# Patient Record
Sex: Female | Born: 1945
Health system: Southern US, Community
[De-identification: ages and names within clinical notes are randomized; demographics above are authoritative.]

## PROBLEM LIST (undated history)

## (undated) DIAGNOSIS — J45990 Exercise induced bronchospasm: Secondary | ICD-10-CM

## (undated) DIAGNOSIS — R432 Parageusia: Secondary | ICD-10-CM

## (undated) DIAGNOSIS — E785 Hyperlipidemia, unspecified: Secondary | ICD-10-CM

## (undated) DIAGNOSIS — I10 Essential (primary) hypertension: Secondary | ICD-10-CM

## (undated) DIAGNOSIS — R05 Cough: Secondary | ICD-10-CM

## (undated) DIAGNOSIS — I447 Left bundle-branch block, unspecified: Secondary | ICD-10-CM

## (undated) DIAGNOSIS — M1990 Unspecified osteoarthritis, unspecified site: Secondary | ICD-10-CM

## (undated) DIAGNOSIS — R9439 Abnormal result of other cardiovascular function study: Secondary | ICD-10-CM

## (undated) DIAGNOSIS — G2581 Restless legs syndrome: Secondary | ICD-10-CM

## (undated) DIAGNOSIS — E039 Hypothyroidism, unspecified: Secondary | ICD-10-CM

## (undated) HISTORY — DX: Parageusia: R43.2

## (undated) HISTORY — PX: TONSILLECTOMY: SUR1361

## (undated) HISTORY — DX: Exercise induced bronchospasm: J45.990

## (undated) HISTORY — PX: LIPOMA EXCISION: SHX5283

## (undated) HISTORY — PX: PARATHYROIDECTOMY: SHX19

## (undated) HISTORY — DX: Left bundle-branch block, unspecified: I44.7

## (undated) HISTORY — DX: Abnormal result of other cardiovascular function study: R94.39

## (undated) HISTORY — DX: Hyperlipidemia, unspecified: E78.5

## (undated) HISTORY — DX: Cough: R05

## (undated) HISTORY — PX: THYROIDECTOMY, PARTIAL: SHX18

## (undated) HISTORY — PX: CARPAL TUNNEL RELEASE: SHX101

---

## 1998-03-16 ENCOUNTER — Ambulatory Visit (HOSPITAL_COMMUNITY): Admission: RE | Admit: 1998-03-16 | Discharge: 1998-03-16 | Payer: Self-pay | Admitting: *Deleted

## 1998-06-23 ENCOUNTER — Ambulatory Visit (HOSPITAL_COMMUNITY): Admission: RE | Admit: 1998-06-23 | Discharge: 1998-06-24 | Payer: Self-pay

## 1998-08-09 ENCOUNTER — Other Ambulatory Visit: Admission: RE | Admit: 1998-08-09 | Discharge: 1998-08-09 | Payer: Self-pay | Admitting: Obstetrics and Gynecology

## 1998-08-26 ENCOUNTER — Other Ambulatory Visit: Admission: RE | Admit: 1998-08-26 | Discharge: 1998-08-26 | Payer: Self-pay | Admitting: Obstetrics and Gynecology

## 1998-11-05 HISTORY — PX: BREAST EXCISIONAL BIOPSY: SUR124

## 1999-08-15 ENCOUNTER — Other Ambulatory Visit: Admission: RE | Admit: 1999-08-15 | Discharge: 1999-08-15 | Payer: Self-pay | Admitting: Obstetrics and Gynecology

## 1999-08-16 ENCOUNTER — Encounter: Admission: RE | Admit: 1999-08-16 | Discharge: 1999-08-16 | Payer: Self-pay

## 1999-08-17 ENCOUNTER — Ambulatory Visit (HOSPITAL_BASED_OUTPATIENT_CLINIC_OR_DEPARTMENT_OTHER): Admission: RE | Admit: 1999-08-17 | Discharge: 1999-08-17 | Payer: Self-pay

## 2000-01-08 ENCOUNTER — Encounter: Payer: Self-pay | Admitting: Obstetrics and Gynecology

## 2000-01-08 ENCOUNTER — Encounter: Admission: RE | Admit: 2000-01-08 | Discharge: 2000-01-08 | Payer: Self-pay | Admitting: Obstetrics and Gynecology

## 2000-01-15 ENCOUNTER — Encounter: Payer: Self-pay | Admitting: Obstetrics and Gynecology

## 2000-01-15 ENCOUNTER — Encounter: Admission: RE | Admit: 2000-01-15 | Discharge: 2000-01-15 | Payer: Self-pay | Admitting: Obstetrics and Gynecology

## 2000-07-27 ENCOUNTER — Encounter: Payer: Self-pay | Admitting: Family Medicine

## 2000-07-27 ENCOUNTER — Ambulatory Visit (HOSPITAL_COMMUNITY): Admission: RE | Admit: 2000-07-27 | Discharge: 2000-07-27 | Payer: Self-pay | Admitting: Family Medicine

## 2000-08-13 ENCOUNTER — Encounter: Payer: Self-pay | Admitting: Family Medicine

## 2000-08-13 ENCOUNTER — Ambulatory Visit: Admission: RE | Admit: 2000-08-13 | Discharge: 2000-08-13 | Payer: Self-pay | Admitting: *Deleted

## 2000-09-10 ENCOUNTER — Other Ambulatory Visit: Admission: RE | Admit: 2000-09-10 | Discharge: 2000-09-10 | Payer: Self-pay | Admitting: Obstetrics and Gynecology

## 2000-12-25 ENCOUNTER — Encounter: Admission: RE | Admit: 2000-12-25 | Discharge: 2000-12-25 | Payer: Self-pay | Admitting: Obstetrics and Gynecology

## 2000-12-25 ENCOUNTER — Encounter: Payer: Self-pay | Admitting: Obstetrics and Gynecology

## 2001-08-29 ENCOUNTER — Ambulatory Visit (HOSPITAL_COMMUNITY): Admission: RE | Admit: 2001-08-29 | Discharge: 2001-08-29 | Payer: Self-pay | Admitting: *Deleted

## 2001-10-15 ENCOUNTER — Other Ambulatory Visit: Admission: RE | Admit: 2001-10-15 | Discharge: 2001-10-15 | Payer: Self-pay | Admitting: Obstetrics and Gynecology

## 2001-12-12 ENCOUNTER — Encounter: Payer: Self-pay | Admitting: Family Medicine

## 2001-12-12 ENCOUNTER — Ambulatory Visit (HOSPITAL_COMMUNITY): Admission: RE | Admit: 2001-12-12 | Discharge: 2001-12-12 | Payer: Self-pay | Admitting: Family Medicine

## 2002-01-01 ENCOUNTER — Encounter: Payer: Self-pay | Admitting: Obstetrics and Gynecology

## 2002-01-01 ENCOUNTER — Encounter: Admission: RE | Admit: 2002-01-01 | Discharge: 2002-01-01 | Payer: Self-pay | Admitting: Obstetrics and Gynecology

## 2002-10-21 ENCOUNTER — Other Ambulatory Visit: Admission: RE | Admit: 2002-10-21 | Discharge: 2002-10-21 | Payer: Self-pay | Admitting: Obstetrics and Gynecology

## 2002-12-07 ENCOUNTER — Encounter: Admission: RE | Admit: 2002-12-07 | Discharge: 2002-12-07 | Payer: Self-pay | Admitting: Neurology

## 2002-12-07 ENCOUNTER — Encounter: Payer: Self-pay | Admitting: Neurology

## 2003-01-11 ENCOUNTER — Encounter: Admission: RE | Admit: 2003-01-11 | Discharge: 2003-01-11 | Payer: Self-pay | Admitting: Family Medicine

## 2003-01-11 ENCOUNTER — Encounter: Payer: Self-pay | Admitting: Family Medicine

## 2003-11-16 ENCOUNTER — Other Ambulatory Visit: Admission: RE | Admit: 2003-11-16 | Discharge: 2003-11-16 | Payer: Self-pay | Admitting: Obstetrics and Gynecology

## 2004-01-14 ENCOUNTER — Encounter: Admission: RE | Admit: 2004-01-14 | Discharge: 2004-01-14 | Payer: Self-pay | Admitting: Obstetrics and Gynecology

## 2004-01-21 ENCOUNTER — Ambulatory Visit (HOSPITAL_COMMUNITY): Admission: RE | Admit: 2004-01-21 | Discharge: 2004-01-21 | Payer: Self-pay | Admitting: Family Medicine

## 2004-08-24 ENCOUNTER — Encounter (INDEPENDENT_AMBULATORY_CARE_PROVIDER_SITE_OTHER): Payer: Self-pay | Admitting: *Deleted

## 2004-08-24 ENCOUNTER — Ambulatory Visit (HOSPITAL_COMMUNITY): Admission: RE | Admit: 2004-08-24 | Discharge: 2004-08-24 | Payer: Self-pay | Admitting: Surgery

## 2004-08-24 ENCOUNTER — Ambulatory Visit (HOSPITAL_BASED_OUTPATIENT_CLINIC_OR_DEPARTMENT_OTHER): Admission: RE | Admit: 2004-08-24 | Discharge: 2004-08-24 | Payer: Self-pay | Admitting: Surgery

## 2004-12-11 ENCOUNTER — Other Ambulatory Visit: Admission: RE | Admit: 2004-12-11 | Discharge: 2004-12-11 | Payer: Self-pay | Admitting: Obstetrics and Gynecology

## 2005-01-19 ENCOUNTER — Encounter: Admission: RE | Admit: 2005-01-19 | Discharge: 2005-01-19 | Payer: Self-pay | Admitting: Obstetrics and Gynecology

## 2005-09-15 ENCOUNTER — Ambulatory Visit (HOSPITAL_COMMUNITY): Admission: RE | Admit: 2005-09-15 | Discharge: 2005-09-15 | Payer: Self-pay | Admitting: Family Medicine

## 2005-09-29 ENCOUNTER — Ambulatory Visit (HOSPITAL_COMMUNITY): Admission: RE | Admit: 2005-09-29 | Discharge: 2005-09-29 | Payer: Self-pay | Admitting: Family Medicine

## 2006-01-03 ENCOUNTER — Other Ambulatory Visit: Admission: RE | Admit: 2006-01-03 | Discharge: 2006-01-03 | Payer: Self-pay | Admitting: Obstetrics and Gynecology

## 2006-01-21 ENCOUNTER — Encounter: Admission: RE | Admit: 2006-01-21 | Discharge: 2006-01-21 | Payer: Self-pay | Admitting: Obstetrics and Gynecology

## 2007-01-23 ENCOUNTER — Encounter: Admission: RE | Admit: 2007-01-23 | Discharge: 2007-01-23 | Payer: Self-pay | Admitting: Obstetrics and Gynecology

## 2008-01-26 ENCOUNTER — Encounter: Admission: RE | Admit: 2008-01-26 | Discharge: 2008-01-26 | Payer: Self-pay | Admitting: Obstetrics and Gynecology

## 2008-07-07 ENCOUNTER — Ambulatory Visit: Admission: RE | Admit: 2008-07-07 | Discharge: 2008-07-07 | Payer: Self-pay | Admitting: Family Medicine

## 2008-07-07 LAB — PULMONARY FUNCTION TEST

## 2008-11-14 ENCOUNTER — Emergency Department (HOSPITAL_COMMUNITY): Admission: EM | Admit: 2008-11-14 | Discharge: 2008-11-14 | Payer: Self-pay | Admitting: Emergency Medicine

## 2009-01-26 ENCOUNTER — Encounter: Admission: RE | Admit: 2009-01-26 | Discharge: 2009-01-26 | Payer: Self-pay | Admitting: Obstetrics and Gynecology

## 2009-10-03 ENCOUNTER — Encounter: Admission: RE | Admit: 2009-10-03 | Discharge: 2009-10-03 | Payer: Self-pay | Admitting: Family Medicine

## 2010-01-31 ENCOUNTER — Encounter: Admission: RE | Admit: 2010-01-31 | Discharge: 2010-01-31 | Payer: Self-pay | Admitting: Obstetrics and Gynecology

## 2010-04-20 ENCOUNTER — Encounter: Admission: RE | Admit: 2010-04-20 | Discharge: 2010-04-20 | Payer: Self-pay | Admitting: General Surgery

## 2010-06-26 ENCOUNTER — Ambulatory Visit (HOSPITAL_BASED_OUTPATIENT_CLINIC_OR_DEPARTMENT_OTHER): Admission: RE | Admit: 2010-06-26 | Discharge: 2010-06-26 | Payer: Self-pay | Admitting: Surgery

## 2010-12-26 ENCOUNTER — Other Ambulatory Visit: Payer: Self-pay | Admitting: Obstetrics and Gynecology

## 2010-12-26 DIAGNOSIS — Z1231 Encounter for screening mammogram for malignant neoplasm of breast: Secondary | ICD-10-CM

## 2011-01-19 LAB — POCT HEMOGLOBIN-HEMACUE: Hemoglobin: 15.3 g/dL — ABNORMAL HIGH (ref 12.0–15.0)

## 2011-02-02 ENCOUNTER — Ambulatory Visit
Admission: RE | Admit: 2011-02-02 | Discharge: 2011-02-02 | Disposition: A | Discharge status: Home / Self Care | Payer: Self-pay | Source: Ambulatory Visit | Attending: Obstetrics and Gynecology | Admitting: Obstetrics and Gynecology

## 2011-02-02 DIAGNOSIS — Z1231 Encounter for screening mammogram for malignant neoplasm of breast: Secondary | ICD-10-CM

## 2011-03-23 NOTE — Op Note (Signed)
Kristine Cooper, Kristine Cooper               ACCOUNT NO.:  [id number]   MEDICAL RECORD NO.:  [id number]          PATIENT TYPE:  AMB   LOCATION:  [facility]                         FACILITY:  [facility]   PHYSICIAN:  Velora Heckler, MD      DATE OF BIRTH:  10-10-1946   DATE OF PROCEDURE:  08/24/2004  DATE OF DISCHARGE:                                 OPERATIVE REPORT   PREOPERATIVE DIAGNOSIS:  Right posterior cervical lymphadenopathy.   POSTOPERATIVE DIAGNOSIS:  Same.   PROCEDURE:  Right posterior cervical lymph node biopsy.   SURGEON:  Velora Heckler, M.D.   ANESTHESIA:  Local with intravenous sedation.   PREPARATION:  Betadine.   COMPLICATIONS:  None.   INDICATIONS:  The patient is a 65 year old white female with persistent  right posterior cervical lymphadenopathy.  There is no apparent etiology.  These have been persistent.  She now comes to surgery for excisional biopsy.   BODY OF REPORT:  The procedure was done in O.R. #3 at the [facility].  The patient was brought to the operating room and placed in  a left lateral decubitus position on the operating room table.  Following  administration of intravenous sedation, the patient was prepped and draped  in the usual strict aseptic fashion.  Two lymph nodes had been marked on the  skin.  Skin was anesthetized in this region with local anesthetic.  A 2 cm  incision was made between the two lymph nodes.  Dissection was carried down  through subcutaneous tissues and platysma.  Dissection was carried along the  edge of the trapezius muscle.  Four lymph nodes were identified in the  posterior cervical chain and excised using the electrocautery for  hemostasis.  Two of the lymph nodes were approximately 7-8 mm in size.  Two  smaller lymph nodes were only about 4 mm in size.  Good hemostasis was  noted.  Specimen was submitted fresh to pathology, where it was received by  Dr. Guerry Bruin.  Case was discussed with him on the telephone.   Good  hemostasis was noted.  Platysma was closed with interrupted 3-0 Vicryl  sutures.  Skin was closed with interrupted 4-0 Vicryl subcuticular sutures.  Wound was  washed and dried, and Benzoin and Steri-Strips were applied.  Sterile  dressings were applied.  The patient was awakened from anesthesia and  brought to the recovery room.  The patient tolerated the procedure well.     Todd   TMG/MEDQ  D:  08/24/2004  T:  08/24/2004  Job:  [id number]   cc:   Velora Heckler, MD  [address]  [place]  [place] [address]  Fax: [phone]   Meredith Staggers, M.D.  [address]  [place]  [place] [address]  Fax: [phone]

## 2011-03-23 NOTE — Procedures (Signed)
[facility]  Patient:    Kristine Cooper, BATTISTE Visit Number: [id number] MRN: [id number]          Service Type: END Location: ENDO Attending Physician:  Louie Bun Proc. Date: 08/29/01 Admit Date:  08/29/2001 Discharge Date: 08/29/2001   CC:         Meredith Staggers, M.D.   Procedure Report  PROCEDURE:  Colonoscopy.  INDICATION:  Rectal bleeding and a family history of colon cancer in her father.  DESCRIPTION OF PROCEDURE:  The patient was placed in the left lateral decubitus position and placed on the pulse monitor with continuous low-flow oxygen delivered by nasal cannula.  She was sedated with 62.5 mcg IV fentanyl and 8 mg of IV Versed.  The Olympus video colonoscope was inserted into the rectum and advanced to the cecum, confirmed by transillumination at McBurneys point and visualization of the ileocecal valve and appendiceal orifice.  The prep was good.  The cecum, ascending, transverse, and descending and sigmoid colon all appeared normal with no masses, polyps, diverticula or other mucosal abnormalities.  The rectum likewise appeared normal, and retroflex view of the anus some small internal hemorrhoids with no stigma of hemorrhage.  The colonoscope was then withdrawn, and the patient returned to the recovery room in stable condition.  She tolerated the procedure well, and there were no immediate complications.  IMPRESSION: 1. Internal hemorrhoids. 2. Otherwise normal colonoscopy.  PLAN:  Consider repeat colonoscopy in five years based on family history. Attending Physician:  Louie Bun DD:  08/29/01 TD:  09/01/01 Job: [id number]

## 2011-07-21 ENCOUNTER — Emergency Department (HOSPITAL_COMMUNITY)
Admission: EM | Admit: 2011-07-21 | Discharge: 2011-07-21 | Disposition: A | Discharge status: Home / Self Care | Payer: BC Managed Care – PPO | Attending: Emergency Medicine | Admitting: Emergency Medicine

## 2011-07-21 DIAGNOSIS — E039 Hypothyroidism, unspecified: Secondary | ICD-10-CM | POA: Insufficient documentation

## 2011-07-21 DIAGNOSIS — Z79899 Other long term (current) drug therapy: Secondary | ICD-10-CM | POA: Insufficient documentation

## 2011-07-21 DIAGNOSIS — Z23 Encounter for immunization: Secondary | ICD-10-CM | POA: Insufficient documentation

## 2011-07-21 DIAGNOSIS — S61209A Unspecified open wound of unspecified finger without damage to nail, initial encounter: Secondary | ICD-10-CM | POA: Insufficient documentation

## 2011-07-21 DIAGNOSIS — IMO0001 Reserved for inherently not codable concepts without codable children: Secondary | ICD-10-CM | POA: Insufficient documentation

## 2011-07-21 DIAGNOSIS — Z203 Contact with and (suspected) exposure to rabies: Secondary | ICD-10-CM | POA: Insufficient documentation

## 2011-07-24 ENCOUNTER — Inpatient Hospital Stay (INDEPENDENT_AMBULATORY_CARE_PROVIDER_SITE_OTHER)
Admission: RE | Admit: 2011-07-24 | Discharge: 2011-07-24 | Disposition: A | Discharge status: Home / Self Care | Payer: BC Managed Care – PPO | Source: Ambulatory Visit | Attending: Family Medicine | Admitting: Family Medicine

## 2011-07-24 DIAGNOSIS — Z23 Encounter for immunization: Secondary | ICD-10-CM

## 2011-07-28 ENCOUNTER — Inpatient Hospital Stay (INDEPENDENT_AMBULATORY_CARE_PROVIDER_SITE_OTHER)
Admission: RE | Admit: 2011-07-28 | Discharge: 2011-07-28 | Disposition: A | Discharge status: Home / Self Care | Payer: BC Managed Care – PPO | Source: Ambulatory Visit | Attending: Family Medicine | Admitting: Family Medicine

## 2011-07-28 DIAGNOSIS — Z23 Encounter for immunization: Secondary | ICD-10-CM

## 2011-08-04 ENCOUNTER — Inpatient Hospital Stay (INDEPENDENT_AMBULATORY_CARE_PROVIDER_SITE_OTHER)
Admission: RE | Admit: 2011-08-04 | Discharge: 2011-08-04 | Disposition: A | Discharge status: Home / Self Care | Payer: BC Managed Care – PPO | Source: Ambulatory Visit | Attending: Family Medicine | Admitting: Family Medicine

## 2011-08-04 DIAGNOSIS — Z23 Encounter for immunization: Secondary | ICD-10-CM

## 2011-12-25 ENCOUNTER — Other Ambulatory Visit: Payer: Self-pay | Admitting: Obstetrics and Gynecology

## 2011-12-25 DIAGNOSIS — Z1231 Encounter for screening mammogram for malignant neoplasm of breast: Secondary | ICD-10-CM

## 2012-02-12 ENCOUNTER — Ambulatory Visit
Admission: RE | Admit: 2012-02-12 | Discharge: 2012-02-12 | Disposition: A | Discharge status: Home / Self Care | Payer: BC Managed Care – PPO | Source: Ambulatory Visit | Attending: Obstetrics and Gynecology | Admitting: Obstetrics and Gynecology

## 2012-02-12 DIAGNOSIS — Z1231 Encounter for screening mammogram for malignant neoplasm of breast: Secondary | ICD-10-CM

## 2013-01-13 ENCOUNTER — Other Ambulatory Visit: Payer: Self-pay

## 2013-01-13 DIAGNOSIS — Z1231 Encounter for screening mammogram for malignant neoplasm of breast: Secondary | ICD-10-CM

## 2013-02-12 ENCOUNTER — Ambulatory Visit
Admission: RE | Admit: 2013-02-12 | Discharge: 2013-02-12 | Disposition: A | Discharge status: Home / Self Care | Payer: BC Managed Care – PPO | Source: Ambulatory Visit

## 2013-02-12 DIAGNOSIS — Z1231 Encounter for screening mammogram for malignant neoplasm of breast: Secondary | ICD-10-CM

## 2013-05-18 DIAGNOSIS — H04129 Dry eye syndrome of unspecified lacrimal gland: Secondary | ICD-10-CM | POA: Diagnosis not present

## 2013-05-18 DIAGNOSIS — H2510 Age-related nuclear cataract, unspecified eye: Secondary | ICD-10-CM | POA: Diagnosis not present

## 2013-06-24 DIAGNOSIS — R079 Chest pain, unspecified: Secondary | ICD-10-CM | POA: Diagnosis not present

## 2013-06-24 DIAGNOSIS — J449 Chronic obstructive pulmonary disease, unspecified: Secondary | ICD-10-CM | POA: Diagnosis not present

## 2013-06-24 DIAGNOSIS — E039 Hypothyroidism, unspecified: Secondary | ICD-10-CM | POA: Diagnosis not present

## 2013-07-01 DIAGNOSIS — R079 Chest pain, unspecified: Secondary | ICD-10-CM | POA: Diagnosis not present

## 2013-07-01 DIAGNOSIS — E039 Hypothyroidism, unspecified: Secondary | ICD-10-CM | POA: Diagnosis not present

## 2013-07-01 DIAGNOSIS — J449 Chronic obstructive pulmonary disease, unspecified: Secondary | ICD-10-CM | POA: Diagnosis not present

## 2013-07-01 DIAGNOSIS — E785 Hyperlipidemia, unspecified: Secondary | ICD-10-CM | POA: Diagnosis not present

## 2013-07-07 DIAGNOSIS — E785 Hyperlipidemia, unspecified: Secondary | ICD-10-CM | POA: Diagnosis not present

## 2013-07-07 DIAGNOSIS — E039 Hypothyroidism, unspecified: Secondary | ICD-10-CM | POA: Diagnosis not present

## 2013-07-07 DIAGNOSIS — R079 Chest pain, unspecified: Secondary | ICD-10-CM | POA: Diagnosis not present

## 2013-07-07 DIAGNOSIS — J449 Chronic obstructive pulmonary disease, unspecified: Secondary | ICD-10-CM | POA: Diagnosis not present

## 2013-07-10 DIAGNOSIS — R079 Chest pain, unspecified: Secondary | ICD-10-CM | POA: Diagnosis not present

## 2013-07-13 ENCOUNTER — Other Ambulatory Visit: Payer: Self-pay | Admitting: Interventional Cardiology

## 2013-07-16 ENCOUNTER — Encounter (HOSPITAL_BASED_OUTPATIENT_CLINIC_OR_DEPARTMENT_OTHER)
Admission: RE | Disposition: A | Discharge status: Home / Self Care | Payer: Self-pay | Source: Ambulatory Visit | Attending: Interventional Cardiology

## 2013-07-16 ENCOUNTER — Inpatient Hospital Stay (HOSPITAL_BASED_OUTPATIENT_CLINIC_OR_DEPARTMENT_OTHER)
Admission: RE | Admit: 2013-07-16 | Discharge: 2013-07-16 | Disposition: A | Discharge status: Home / Self Care | Payer: Medicare Other | Source: Ambulatory Visit | Attending: Interventional Cardiology | Admitting: Interventional Cardiology

## 2013-07-16 ENCOUNTER — Encounter (HOSPITAL_BASED_OUTPATIENT_CLINIC_OR_DEPARTMENT_OTHER): Payer: Self-pay | Admitting: *Deleted

## 2013-07-16 DIAGNOSIS — R0789 Other chest pain: Secondary | ICD-10-CM | POA: Diagnosis present

## 2013-07-16 DIAGNOSIS — R079 Chest pain, unspecified: Secondary | ICD-10-CM | POA: Insufficient documentation

## 2013-07-16 DIAGNOSIS — R9439 Abnormal result of other cardiovascular function study: Secondary | ICD-10-CM

## 2013-07-16 HISTORY — DX: Hypothyroidism, unspecified: E03.9

## 2013-07-16 HISTORY — DX: Abnormal result of other cardiovascular function study: R94.39

## 2013-07-16 SURGERY — JV LEFT HEART CATHETERIZATION WITH CORONARY ANGIOGRAM

## 2013-07-16 MED ORDER — SODIUM CHLORIDE 0.9 % IJ SOLN: 3.0000 mL | INTRAMUSCULAR | Status: DC | PRN

## 2013-07-16 MED ORDER — SODIUM CHLORIDE 0.9 % IV SOLN: 1.0000 mL/kg/h | INTRAVENOUS | Status: DC

## 2013-07-16 MED ORDER — SODIUM CHLORIDE 0.9 % IV SOLN
INTRAVENOUS | Status: DC
  Administered 2013-07-16: 10:00:00 via INTRAVENOUS

## 2013-07-16 MED ORDER — SODIUM CHLORIDE 0.9 % IV SOLN: 250.0000 mL | INTRAVENOUS | Status: DC | PRN

## 2013-07-16 MED ORDER — ONDANSETRON HCL 4 MG/2ML IJ SOLN: 4.0000 mg | Freq: Four times a day (QID) | INTRAMUSCULAR | Status: DC | PRN

## 2013-07-16 MED ORDER — SODIUM CHLORIDE 0.9 % IJ SOLN: 3.0000 mL | Freq: Two times a day (BID) | INTRAMUSCULAR | Status: DC

## 2013-07-16 MED ORDER — ACETAMINOPHEN 325 MG PO TABS: 650.0000 mg | ORAL_TABLET | ORAL | Status: DC | PRN

## 2013-07-16 MED ORDER — DIAZEPAM 5 MG PO TABS
5.0000 mg | ORAL_TABLET | ORAL | Status: AC
  Administered 2013-07-16: 5 mg via ORAL

## 2013-07-16 MED ORDER — ASPIRIN 81 MG PO CHEW
324.0000 mg | CHEWABLE_TABLET | ORAL | Status: AC
  Administered 2013-07-16: 324 mg via ORAL

## 2013-07-16 NOTE — CV Procedure (Signed)
     Diagnostic Cardiac Catheterization Report  Kristine Cooper  67 y.o.  female 12/23/1945  Procedure Date: 07/16/2013 Referring Physician: Noberto Retort, M.D. Primary Cardiologist:: HWB Leia Alf, M.D.   PROCEDURE:  Left heart catheterization with selective coronary angiography, left ventriculogram.  INDICATIONS:  Recent exertional chest pressure, with abnormal nuclear stress test. Abnormalities included ST segment changes of ischemia EKG and a borderline/mild anterior wall perfusion defect.  The risks, benefits, and details of the procedure were explained to the patient.  The patient verbalized understanding and wanted to proceed.  Informed written consent was obtained.  PROCEDURE TECHNIQUE:  After Xylocaine anesthesia a 4 French sheath was placed in the right femoral artery with a single anterior needle wall stick.   Coronary angiography was done using a 4 French 4 cm left Judkins, no torque right coronary, and A2MP catheter.  Left ventriculography was done using a A2MP catheter.    CONTRAST:  Total of 80 cc.  COMPLICATIONS:  None.    HEMODYNAMICS:  Aortic pressure was 105/60 mmHg; LV pressure was 105/7 mmHg; LVEDP 13 mm mercury.  There was no gradient between the left ventricle and aorta.    ANGIOGRAPHIC DATA:   The left main coronary artery is normal..  The left anterior descending artery is  Moderate proximal calcification with no significant luminal narrowing. Mid to distal LAD systolic compression/bridging..  The left circumflex artery is normal.  The right coronary artery is moderate proximal to mid calcification with no obstruction. The vessel is dominant.[name]  LEFT VENTRICULOGRAM:  Left ventricular angiogram was done in the 30 RAO projection and revealed normal left ventricular wall motion and systolic function with an estimated ejection fraction of 65 %.  LVEDP was normal.  IMPRESSIONS:  1. Proximal LAD and RCA calcification  2. No obstructive coronary lesions  are noted in the epicardial coronaries. All territories including branches are widely patent. There is systolic compression/bridging in the mid to distal LAD segment.  3. Normal LV function  4. False positive stress electrocardiographic changes. The mild perfusion defect in the mid anterior wall on nuclear imaging was likely due to breast attenuation   RECOMMENDATION:  No significant coronary explanation for the patient's exertional chest pain..  Consider antilipid therapy as primary prevention along with a baby aspirin daily.

## 2013-07-16 NOTE — H&P (Signed)
  Please see the scan records from [facility] cardiology. The patient has a several month history of exertional chest tightness and a recent exercise nuclear study that had ischemic EKG changes and mild anterior wall ischemia. Beta blocker therapy has not affected the patient's symptoms. Coronary angiography was then performed to confirm the diagnosis and help guide therapy.  The procedure and its risks have been discussed with the patient in detail and except. Those risks discussed were stroke, death, myocardial infarction, bleeding, allergy, kidney injury, among others.

## 2013-07-16 NOTE — Progress Notes (Signed)
Pt received from cath procedure alert and denies any pain at this time.  Pt able to tolerated fluid .  DR Katrinka Blazing in to talk to pt and family about the results of test and plan of care.

## 2013-07-22 DIAGNOSIS — Z23 Encounter for immunization: Secondary | ICD-10-CM | POA: Diagnosis not present

## 2013-08-11 DIAGNOSIS — Z23 Encounter for immunization: Secondary | ICD-10-CM | POA: Diagnosis not present

## 2013-08-11 DIAGNOSIS — Z0000 Encounter for general adult medical examination without abnormal findings: Secondary | ICD-10-CM | POA: Diagnosis not present

## 2013-08-11 DIAGNOSIS — J449 Chronic obstructive pulmonary disease, unspecified: Secondary | ICD-10-CM | POA: Diagnosis not present

## 2013-08-11 DIAGNOSIS — E209 Hypoparathyroidism, unspecified: Secondary | ICD-10-CM | POA: Diagnosis not present

## 2013-08-11 DIAGNOSIS — E039 Hypothyroidism, unspecified: Secondary | ICD-10-CM | POA: Diagnosis not present

## 2013-08-11 DIAGNOSIS — R0789 Other chest pain: Secondary | ICD-10-CM | POA: Diagnosis not present

## 2013-08-11 DIAGNOSIS — E785 Hyperlipidemia, unspecified: Secondary | ICD-10-CM | POA: Diagnosis not present

## 2013-08-24 DIAGNOSIS — B9789 Other viral agents as the cause of diseases classified elsewhere: Secondary | ICD-10-CM | POA: Diagnosis not present

## 2013-08-24 DIAGNOSIS — J029 Acute pharyngitis, unspecified: Secondary | ICD-10-CM | POA: Diagnosis not present

## 2013-08-24 DIAGNOSIS — J209 Acute bronchitis, unspecified: Secondary | ICD-10-CM | POA: Diagnosis not present

## 2013-10-06 ENCOUNTER — Telehealth: Payer: Self-pay | Admitting: *Deleted

## 2013-10-06 NOTE — Telephone Encounter (Signed)
lmtcb x1 

## 2013-10-06 NOTE — Telephone Encounter (Signed)
Message copied by Caryl Ada on Tue Oct 06, 2013 10:15 AM ------      Message from: Coralyn Helling      Created: Mon Oct 05, 2013  5:19 PM       Ms. Rice has consult visit with me scheduled for January.  Can you schedule her for earlier visit this month.  I can work her in anytime on Friday, December 12.  Let me know if she can come on that day.  Thanks. ------

## 2013-10-07 NOTE — Telephone Encounter (Signed)
Pt has been scheduled to see VS on 10/16/13 at 2pm. VS is aware.

## 2013-10-16 ENCOUNTER — Ambulatory Visit (INDEPENDENT_AMBULATORY_CARE_PROVIDER_SITE_OTHER): Payer: Medicare Other | Admitting: Pulmonary Disease

## 2013-10-16 ENCOUNTER — Ambulatory Visit (INDEPENDENT_AMBULATORY_CARE_PROVIDER_SITE_OTHER)
Admission: RE | Admit: 2013-10-16 | Discharge: 2013-10-16 | Disposition: A | Discharge status: Home / Self Care | Payer: Medicare Other | Source: Ambulatory Visit | Attending: Pulmonary Disease | Admitting: Pulmonary Disease

## 2013-10-16 ENCOUNTER — Encounter: Payer: Self-pay | Admitting: Pulmonary Disease

## 2013-10-16 VITALS — BP 124/80 | HR 78 | Ht 59.5 in | Wt 124.0 lb

## 2013-10-16 DIAGNOSIS — R0600 Dyspnea, unspecified: Secondary | ICD-10-CM

## 2013-10-16 DIAGNOSIS — R0609 Other forms of dyspnea: Secondary | ICD-10-CM

## 2013-10-16 DIAGNOSIS — J45990 Exercise induced bronchospasm: Secondary | ICD-10-CM

## 2013-10-16 DIAGNOSIS — R05 Cough: Secondary | ICD-10-CM | POA: Diagnosis not present

## 2013-10-16 DIAGNOSIS — R0602 Shortness of breath: Secondary | ICD-10-CM | POA: Diagnosis not present

## 2013-10-16 DIAGNOSIS — R059 Cough, unspecified: Secondary | ICD-10-CM | POA: Diagnosis not present

## 2013-10-16 DIAGNOSIS — J45909 Unspecified asthma, uncomplicated: Secondary | ICD-10-CM

## 2013-10-16 DIAGNOSIS — J45998 Other asthma: Secondary | ICD-10-CM

## 2013-10-16 HISTORY — DX: Exercise induced bronchospasm: J45.990

## 2013-10-16 MED ORDER — BECLOMETHASONE DIPROPIONATE 80 MCG/ACT IN AERS: 2.0000 | INHALATION_SPRAY | Freq: Two times a day (BID) | RESPIRATORY_TRACT | Status: DC

## 2013-10-16 NOTE — Progress Notes (Deleted)
   Subjective:    Patient ID: Kristine Cooper, female    DOB: [birth year], 67 y.o.   MRN: [id number]  HPI    Review of Systems  Constitutional: Negative for fever, chills, diaphoresis, activity change, appetite change, fatigue and unexpected weight change.  HENT: Negative for congestion, dental problem, ear discharge, ear pain, facial swelling, hearing loss, mouth sores, nosebleeds, postnasal drip, rhinorrhea, sinus pressure, sneezing, sore throat, tinnitus, trouble swallowing and voice change.   Eyes: Negative for photophobia, discharge, itching and visual disturbance.  Respiratory: Positive for cough, chest tightness, shortness of breath and wheezing. Negative for apnea, choking and stridor.   Cardiovascular: Negative for chest pain, palpitations and leg swelling.  Gastrointestinal: Negative for nausea, vomiting, abdominal pain, constipation, blood in stool and abdominal distention.  Genitourinary: Negative for dysuria, urgency, frequency, hematuria, flank pain, decreased urine volume and difficulty urinating.  Musculoskeletal: Negative for arthralgias, back pain, gait problem, joint swelling, myalgias, neck pain and neck stiffness.  Skin: Negative for color change, pallor and rash.  Neurological: Negative for dizziness, tremors, seizures, syncope, speech difficulty, weakness, light-headedness, numbness and headaches.  Hematological: Negative for adenopathy. Does not bruise/bleed easily.  Psychiatric/Behavioral: Negative for confusion, sleep disturbance and agitation. The patient is not nervous/anxious.        Objective:   Physical Exam        Assessment & Plan:

## 2013-10-16 NOTE — Assessment & Plan Note (Signed)
Her symptoms are consistent with asthma.  She reports partial improvement with SABA therapy.  She also has history allergies.  Her spirometry was normal which speaks against COPD.  She does have hyperinflation on her chest xray, but this can be seen with asthma.  Will have her stop spiriva.  Will instead have her use Qvar two puffs twice per day.  She can continue prn proair.  Will re-assess her status in few weeks.  At that time will determine if she needs further allergy testing also.

## 2013-10-16 NOTE — Patient Instructions (Signed)
Qvar two puffs twice per day, and rinse mouth after each use Proair two puffs up to four times per day as needed for cough, wheeze, or chest congestion.  Can also use proair 10 to 15 minutes before exercising if needed. Stop using spiriva Follow up in 6 weeks

## 2013-10-16 NOTE — Assessment & Plan Note (Signed)
Discussed doing warm up routine to pre-condition her airways prior to exercising, and that she can also use proair as needed prior to exercising.     

## 2013-10-16 NOTE — Progress Notes (Signed)
Chief Complaint  Patient presents with  . Pulmonary Consult    History of Present Illness: Kristine Cooper is a 67 y.o. female never smoker for evaluation of dyspnea.  She has noticed trouble with her breathing for years.  She thinks this started after she had pneumonia in 2006.  Since then she gets frequent episodes of bronchitis.  She tends to have more trouble in the Fall with allergies.  She gets sinus congestion and post-nasal drip at times.  She also gets trouble around strong smells such as perfumes and car exhaust.  She noticed more trouble in August of 2014.  She was getting chest tightness and pain, and would feel winded with exertion.  She was seen by Dr. Verdis Prime and had extensive cardiac evaluation.  She had nuclear stress test which showed possible anterior wall ischemia.  She was tried on beta blocker therapy w/o improvement.  She eventually had cardiac catheterization which was  Negative for coronary obstruction.  She was then advised to have pulmonary evaluation.  She had spirometry in 2009 and was told she had COPD.  Her parents and husband used to smoke.  Her mother had emphysema.  She has never smoked.    She will sometimes get wheezing, and this can happen at night also.  She gets wheezing from her chest.  She also notices trouble in hot/humid weather or cold/dry weather.  She will get into coughing spells and bring up yellow sputum  She has never had allergy testing, but thinks she is allergic to ragweed.  She does not have difficulty taking aspirin.  There is no history of nasal polyps.  She denies history of eczema or hives.  She is not aware of allergies to foods or medicine.  She has noticed trouble with her breathing while exercising.  She does cardiovascular exercises.  Her breathing trouble starts about 5 to 10 minutes after she starts exercising, and can take 30 to 40 minutes to resolve after she stops exercising.  She has been tried on spiriva and breo.  She  was only on breo for about 2 or 3 weeks, and is not sure if this helped.  She does not feel like spiriva helps much.  She has to use her rescue inhaler once or twice per week >> this helps when she uses it.  She is from [place], [place], but has lived in [place] for the past 30 years.  She does not have any pets.  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  There is no history of exposure to tuberculosis.  She denies any recent travel.  Tests: Spirometry 07/07/08 >> FEV1 2.29 (118%), FEV1% 80, borderline BD from FEF 25-75% Lt heart cath 07/16/13 >> Proximal LAD and RCA calcification, no coronary obstruction, normal LV function Spirometry 10/16/13 >> FEV1 2.08 (107%), FEV1% 81  Kristine Cooper  has a past medical history of COPD (chronic obstructive pulmonary disease) and Hypothyroidism.  Kristine Cooper  has past surgical history that includes Thyroidectomy, partial; Parathyroidectomy; and Lipoma excision.  Prior to Admission medications   Medication Sig Start Date End Date Taking? Authorizing Provider  albuterol (PROAIR HFA) 108 (90 BASE) MCG/ACT inhaler Inhale 2 puffs into the lungs every 6 (six) hours as needed for wheezing or shortness of breath.   Yes Historical Provider, MD  aspirin 81 MG tablet Take 81 mg by mouth daily.   Yes Historical Provider, MD  levothyroxine (SYNTHROID, LEVOTHROID) 50 MCG tablet Take 50 mcg by mouth daily  before breakfast.   Yes Historical Provider, MD  Multiple Vitamin (MULTIVITAMIN) tablet Take 1 tablet by mouth daily.   Yes Historical Provider, MD  tiotropium (SPIRIVA) 18 MCG inhalation capsule Place 18 mcg into inhaler and inhale daily.   Yes Historical Provider, MD    Allergies  Allergen Reactions  . Codeine Sulfate Nausea And Vomiting  . Demerol [Meperidine] Nausea And Vomiting    Her family history includes Emphysema in her mother.  She  reports that she has never smoked. She does not have any smokeless tobacco history on file. She reports that she does not  drink alcohol or use illicit drugs.  Review of Systems  Constitutional: Negative for fever, chills, diaphoresis, activity change, appetite change, fatigue and unexpected weight change.  HENT: Negative for congestion, dental problem, ear discharge, ear pain, facial swelling, hearing loss, mouth sores, nosebleeds, postnasal drip, rhinorrhea, sinus pressure, sneezing, sore throat, tinnitus, trouble swallowing and voice change.   Eyes: Negative for photophobia, discharge, itching and visual disturbance.  Respiratory: Positive for cough, chest tightness, shortness of breath and wheezing. Negative for apnea, choking and stridor.   Cardiovascular: Negative for chest pain, palpitations and leg swelling.  Gastrointestinal: Negative for nausea, vomiting, abdominal pain, constipation, blood in stool and abdominal distention.  Genitourinary: Negative for dysuria, urgency, frequency, hematuria, flank pain, decreased urine volume and difficulty urinating.  Musculoskeletal: Negative for arthralgias, back pain, gait problem, joint swelling, myalgias, neck pain and neck stiffness.  Skin: Negative for color change, pallor and rash.  Neurological: Negative for dizziness, tremors, seizures, syncope, speech difficulty, weakness, light-headedness, numbness and headaches.  Hematological: Negative for adenopathy. Does not bruise/bleed easily.  Psychiatric/Behavioral: Negative for confusion, sleep disturbance and agitation. The patient is not nervous/anxious.    Physical Exam:  General - No distress ENT - No sinus tenderness, no oral exudate, no LAN, no thyromegaly, TM clear, pupils equal/reactive Cardiac - s1s2 regular, no murmur, pulses symmetric Chest - No wheeze/rales/dullness, good air entry, normal respiratory excursion Back - No focal tenderness Abd - Soft, non-tender, no organomegaly, + bowel sounds Ext - No edema Neuro - Normal strength, cranial nerves intact Skin - No rashes Psych - Normal mood, and  behavior    10/16/2013    CLINICAL DATA:  Cough and shortness of breath   EXAM: CHEST  2 VIEW   COMPARISON:  07/10/2013   FINDINGS:  The heart and pulmonary vascularity are within normal limits. Postsurgical changes are noted in the neck. Calcified granuloma is noted within the right upper lobe. The lungs are clear. No acute bony abnormality is noted.   IMPRESSION:  No acute abnormality seen.    Electronically Signed   By: Alcide Clever M.D.   On: 10/16/2013 15:33     Assessment/Plan:  Coralyn Helling, MD [place] [facility]/Critical Care/Sleep Pager:  [phone]

## 2013-10-31 DIAGNOSIS — J209 Acute bronchitis, unspecified: Secondary | ICD-10-CM | POA: Diagnosis not present

## 2013-11-09 ENCOUNTER — Ambulatory Visit
Admission: RE | Admit: 2013-11-09 | Discharge: 2013-11-09 | Disposition: A | Discharge status: Home / Self Care | Payer: Medicare Other | Source: Ambulatory Visit | Attending: Family Medicine | Admitting: Family Medicine

## 2013-11-09 ENCOUNTER — Other Ambulatory Visit: Payer: Self-pay | Admitting: Family Medicine

## 2013-11-09 DIAGNOSIS — J209 Acute bronchitis, unspecified: Secondary | ICD-10-CM | POA: Diagnosis not present

## 2013-11-09 DIAGNOSIS — J40 Bronchitis, not specified as acute or chronic: Secondary | ICD-10-CM | POA: Diagnosis not present

## 2013-11-10 ENCOUNTER — Institutional Professional Consult (permissible substitution): Payer: Medicare Other | Admitting: Pulmonary Disease

## 2013-12-08 ENCOUNTER — Ambulatory Visit: Payer: Medicare Other | Admitting: Pulmonary Disease

## 2013-12-10 ENCOUNTER — Ambulatory Visit (INDEPENDENT_AMBULATORY_CARE_PROVIDER_SITE_OTHER): Payer: Medicare Other | Admitting: Pulmonary Disease

## 2013-12-10 ENCOUNTER — Encounter: Payer: Self-pay | Admitting: Pulmonary Disease

## 2013-12-10 VITALS — BP 122/84 | HR 72 | Ht 59.5 in | Wt 124.0 lb

## 2013-12-10 DIAGNOSIS — R05 Cough: Secondary | ICD-10-CM

## 2013-12-10 DIAGNOSIS — J45998 Other asthma: Secondary | ICD-10-CM

## 2013-12-10 DIAGNOSIS — R058 Other specified cough: Secondary | ICD-10-CM

## 2013-12-10 DIAGNOSIS — J45909 Unspecified asthma, uncomplicated: Secondary | ICD-10-CM | POA: Diagnosis not present

## 2013-12-10 DIAGNOSIS — R059 Cough, unspecified: Secondary | ICD-10-CM

## 2013-12-10 DIAGNOSIS — J45990 Exercise induced bronchospasm: Secondary | ICD-10-CM

## 2013-12-10 HISTORY — DX: Other specified cough: R05.8

## 2013-12-10 MED ORDER — ALBUTEROL SULFATE HFA 108 (90 BASE) MCG/ACT IN AERS: 2.0000 | INHALATION_SPRAY | Freq: Four times a day (QID) | RESPIRATORY_TRACT | Status: DC | PRN

## 2013-12-10 MED ORDER — FLUTICASONE PROPIONATE 50 MCG/ACT NA SUSP: 2.0000 | Freq: Every day | NASAL | Status: DC

## 2013-12-10 MED ORDER — MOMETASONE FURO-FORMOTEROL FUM 100-5 MCG/ACT IN AERO: 2.0000 | INHALATION_SPRAY | Freq: Two times a day (BID) | RESPIRATORY_TRACT | Status: DC

## 2013-12-10 NOTE — Assessment & Plan Note (Signed)
Likely exacerbated by recent URI.  Will have her use nasal irrigation and flonase.  She can use salt water gargles and sip water when she has urge to cough.  Explained importance of avoiding a forced cough or throat clearing.

## 2013-12-10 NOTE — Assessment & Plan Note (Signed)
She has some improvement with addition of Qvar, but still has troublesome symptoms.  Will give her sample of dulera in place of Qvar.  She is to continue prn proair.  If her symptoms persistent, then may need to add singulair.

## 2013-12-10 NOTE — Patient Instructions (Signed)
Nasal irrigation (saline nasal spray) daily for 2 weeks, then as needed Flonase two spray each nostril daily for two weeks, then as needed Dulera two puffs twice per day >> rinse mouth after each use Do not use Qvar while using dulera Call in two weeks to update status of symptoms Proair two puffs up to four times per day as needed for cough, wheeze, or chest congestion Follow up in 4 months

## 2013-12-10 NOTE — Progress Notes (Signed)
Chief Complaint  Patient presents with  . Shortness of Breath    Breathing is unchanged. Reports having bronchitis since last seeing [place]. SOB, chest tightness and coughing are present. Denies wheezing.    History of Present Illness: Kristine Cooper is a 68 y.o. female never smoker with dyspnea secondary to asthma.  She was feeling some improvement with addition of Qvar.  She still had trouble with exercise.  She would use proair, and this would help.  She developed a bronchitis last month.  She was having fever, sinus congestion, and chest congestion.  She was treated with amoxicillin and then Zpak.  She has improved, but still has persistent symptoms.  She is still having sinus congestion and post-nasal drip.  She has a hoarse, raspy voice.  She is not getting as much wheezing or chest tightness.  She still gets a cough, especially at night when she lays down.  She denies reflux.  TESTS: Spirometry 07/07/08 >> FEV1 2.29 (118%), FEV1% 80, borderline BD from FEF 25-75%  Lt heart cath 07/16/13 >> Proximal LAD and RCA calcification, no coronary obstruction, normal LV function  Spirometry 10/16/13 >> FEV1 2.08 (107%), FEV1% Kristine Cooper  has a past medical history of Hypothyroidism.  Kristine Cooper  has past surgical history that includes Thyroidectomy, partial; Parathyroidectomy; and Lipoma excision.  Prior to Admission medications   Medication Sig Start Date End Date Taking? Authorizing Provider  albuterol (PROAIR HFA) 108 (90 BASE) MCG/ACT inhaler Inhale 2 puffs into the lungs every 6 (six) hours as needed for wheezing or shortness of breath.    Historical Provider, MD  aspirin 81 MG tablet Take 81 mg by mouth daily.    Historical Provider, MD  beclomethasone (QVAR) 80 MCG/ACT inhaler Inhale 2 puffs into the lungs 2 (two) times daily. 10/16/13   Chesley Mires, MD  levothyroxine (SYNTHROID, LEVOTHROID) 50 MCG tablet Take 50 mcg by mouth daily before breakfast.    Historical Provider,  MD  Multiple Vitamin (MULTIVITAMIN) tablet Take 1 tablet by mouth daily.    Historical Provider, MD    Allergies  Allergen Reactions  . Codeine Sulfate Nausea And Vomiting  . Demerol [Meperidine] Nausea And Vomiting     Physical Exam:  General - No distress, hoarse voice quality ENT - No sinus tenderness, no oral exudate, no LAN Cardiac - s1s2 regular, no murmur Chest - No wheeze/rales/dullness Back - No focal tenderness Abd - Soft, non-tender Ext - No edema Neuro - Normal strength Skin - No rashes Psych - normal mood, and behavior   Assessment/Plan:  Chesley Mires, MD [place] [facility]/Critical Care/Sleep Pager:  [phone]

## 2013-12-10 NOTE — Assessment & Plan Note (Signed)
Discussed doing warm up routine to pre-condition her airways prior to exercising, and that she can also use proair as needed prior to exercising.     

## 2013-12-11 ENCOUNTER — Ambulatory Visit
Admission: RE | Admit: 2013-12-11 | Discharge: 2013-12-11 | Disposition: A | Discharge status: Home / Self Care | Payer: Medicare Other | Source: Ambulatory Visit | Attending: Family Medicine | Admitting: Family Medicine

## 2013-12-11 ENCOUNTER — Other Ambulatory Visit: Payer: Self-pay | Admitting: Family Medicine

## 2013-12-11 DIAGNOSIS — S99919A Unspecified injury of unspecified ankle, initial encounter: Secondary | ICD-10-CM | POA: Diagnosis not present

## 2013-12-11 DIAGNOSIS — M79609 Pain in unspecified limb: Secondary | ICD-10-CM | POA: Diagnosis not present

## 2013-12-11 DIAGNOSIS — S99929A Unspecified injury of unspecified foot, initial encounter: Secondary | ICD-10-CM | POA: Diagnosis not present

## 2013-12-11 DIAGNOSIS — M79672 Pain in left foot: Secondary | ICD-10-CM

## 2013-12-11 DIAGNOSIS — S8990XA Unspecified injury of unspecified lower leg, initial encounter: Secondary | ICD-10-CM | POA: Diagnosis not present

## 2013-12-22 ENCOUNTER — Encounter: Payer: Self-pay | Admitting: Pulmonary Disease

## 2013-12-23 MED ORDER — MONTELUKAST SODIUM 10 MG PO TABS: 10.0000 mg | ORAL_TABLET | Freq: Every day | ORAL | Status: DC

## 2013-12-23 NOTE — Telephone Encounter (Signed)
Can try singulair 10 mg nightly.  She can resume Qvar two puffs twice per day.  She should stop using dulera.  She can stop flonase.  She should continue nasal irrigation.

## 2013-12-23 NOTE — Telephone Encounter (Signed)
Please advise how pt should proceed. Thanks!

## 2013-12-27 ENCOUNTER — Encounter: Payer: Self-pay | Admitting: Pulmonary Disease

## 2013-12-28 MED ORDER — BECLOMETHASONE DIPROPIONATE 80 MCG/ACT IN AERS: 2.0000 | INHALATION_SPRAY | Freq: Two times a day (BID) | RESPIRATORY_TRACT | Status: DC

## 2014-01-05 ENCOUNTER — Other Ambulatory Visit: Payer: Self-pay

## 2014-01-05 DIAGNOSIS — Z1231 Encounter for screening mammogram for malignant neoplasm of breast: Secondary | ICD-10-CM

## 2014-02-15 ENCOUNTER — Ambulatory Visit: Payer: Medicare Other

## 2014-02-15 ENCOUNTER — Ambulatory Visit
Admission: RE | Admit: 2014-02-15 | Discharge: 2014-02-15 | Disposition: A | Discharge status: Home / Self Care | Payer: Medicare Other | Source: Ambulatory Visit

## 2014-02-15 DIAGNOSIS — Z1231 Encounter for screening mammogram for malignant neoplasm of breast: Secondary | ICD-10-CM

## 2014-03-01 ENCOUNTER — Ambulatory Visit (INDEPENDENT_AMBULATORY_CARE_PROVIDER_SITE_OTHER): Payer: Medicare Other | Admitting: Pulmonary Disease

## 2014-03-01 ENCOUNTER — Encounter: Payer: Self-pay | Admitting: Pulmonary Disease

## 2014-03-01 VITALS — BP 130/86 | HR 84 | Ht 59.5 in | Wt 121.0 lb

## 2014-03-01 DIAGNOSIS — J45998 Other asthma: Secondary | ICD-10-CM

## 2014-03-01 DIAGNOSIS — J45909 Unspecified asthma, uncomplicated: Secondary | ICD-10-CM

## 2014-03-01 MED ORDER — FLUTICASONE-SALMETEROL 250-50 MCG/DOSE IN AEPB: 1.0000 | INHALATION_SPRAY | Freq: Two times a day (BID) | RESPIRATORY_TRACT | Status: DC

## 2014-03-01 NOTE — Patient Instructions (Signed)
Advair one puff twice per day >> rinse mouth after each use Singulair 10 mg pill nightly Albuterol two puffs up to four times per day as needed Do not use Qvar while using advair Follow up in 4 to 6 week

## 2014-03-01 NOTE — Progress Notes (Signed)
Chief Complaint  Patient presents with  . Asthma    Reports SOB when climbing steps. Coughing is also present.    History of Present Illness: Kristine Cooper is a 68 y.o. female never smoker with dyspnea secondary to asthma.  She has been using Qvar, and this has helped.  She also feels singulair has helped.  She still gets winded going up steps.  She has noticed her breathing getting worse over the past week.  She has also noticed getting itchy ears.  She is getting more cough recently also.  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  TESTS: Spirometry 07/07/08 >> FEV1 2.29 (118%), FEV1% 80, borderline BD from FEF 25-75%  Lt heart cath 07/16/13 >> Proximal LAD and RCA calcification, no coronary obstruction, normal LV function  Spirometry 10/16/13 >> FEV1 2.08 (107%), FEV1% [place]  has a past medical history of Hypothyroidism.  Kristine Cooper  has past surgical history that includes Thyroidectomy, partial; Parathyroidectomy; and Lipoma excision.  Prior to Admission medications   Medication Sig Start Date End Date Taking? Authorizing Provider  albuterol (PROAIR HFA) 108 (90 BASE) MCG/ACT inhaler Inhale 2 puffs into the lungs every 6 (six) hours as needed for wheezing or shortness of breath.    Historical Provider, MD  aspirin 81 MG tablet Take 81 mg by mouth daily.    Historical Provider, MD  beclomethasone (QVAR) 80 MCG/ACT inhaler Inhale 2 puffs into the lungs 2 (two) times daily. 10/16/13   Chesley Mires, MD  levothyroxine (SYNTHROID, LEVOTHROID) 50 MCG tablet Take 50 mcg by mouth daily before breakfast.    Historical Provider, MD  Multiple Vitamin (MULTIVITAMIN) tablet Take 1 tablet by mouth daily.    Historical Provider, MD    Allergies  Allergen Reactions  . Codeine Sulfate Nausea And Vomiting  . Demerol [Meperidine] Nausea And Vomiting     Physical Exam:  General - No distress, hoarse voice quality ENT - No sinus tenderness, no oral exudate,  no LAN Cardiac - s1s2 regular, no murmur Chest - No wheeze/rales/dullness Back - No focal tenderness Abd - Soft, non-tender Ext - No edema Neuro - Normal strength Skin - No rashes Psych - normal mood, and behavior   Assessment/Plan:  Chesley Mires, MD [place] [facility]/Critical Care/Sleep Pager:  [phone]

## 2014-03-06 DIAGNOSIS — J309 Allergic rhinitis, unspecified: Secondary | ICD-10-CM | POA: Diagnosis not present

## 2014-03-07 NOTE — Assessment & Plan Note (Signed)
She has persistent symptoms.  Still think this is related to asthma.  Will have her stop qvar and start advair.  She is to continue singulair and prn albuterol.  If her symptoms persist, then she may need further evaluation to determine if she has additional/alternate cause to her dyspnea.

## 2014-03-09 ENCOUNTER — Telehealth: Payer: Self-pay | Admitting: Pulmonary Disease

## 2014-03-09 NOTE — Telephone Encounter (Signed)
Received the following message through a pt questionnaire: Went to [facility] on Saturday; thankfully my lungs are clear; Dr. Sabra Heck put me on a decongestant and said to continue using inhaler. Am wondering if using Advair has caused these symptoms.   VS - please advise. Thanks.

## 2014-03-09 NOTE — Telephone Encounter (Deleted)
How are you feeling after your recent visit? Not well   Does the recommended course of treatment seem to be helping your symptoms? Uncertain   Are you experiencing any side effects from your recommended treatment? Yes, have a deep cough and laryngitis   is there anything else you would like to ask your physician? Went to [facility] on Saturday; thankfully my lungs are clear; Dr. Sabra Heck put me on a decongestant and said to continue using inhaler. Am wondering if using Advair has caused these symptoms.

## 2014-03-10 DIAGNOSIS — J40 Bronchitis, not specified as acute or chronic: Secondary | ICD-10-CM | POA: Diagnosis not present

## 2014-03-10 NOTE — Telephone Encounter (Signed)
Pt returning call pls call cell # [phone].Kristine Cooper

## 2014-03-10 NOTE — Telephone Encounter (Signed)
lmtcb x1 

## 2014-03-10 NOTE — Telephone Encounter (Signed)
Please inform pt that unlikely advair is causing congestion.  Recommend she continue using advair.

## 2014-03-10 NOTE — Telephone Encounter (Signed)
Pt is aware of VS's response. Nothing further was needed.

## 2014-03-15 DIAGNOSIS — Z01419 Encounter for gynecological examination (general) (routine) without abnormal findings: Secondary | ICD-10-CM | POA: Diagnosis not present

## 2014-03-15 DIAGNOSIS — N959 Unspecified menopausal and perimenopausal disorder: Secondary | ICD-10-CM | POA: Diagnosis not present

## 2014-03-15 DIAGNOSIS — M810 Age-related osteoporosis without current pathological fracture: Secondary | ICD-10-CM | POA: Diagnosis not present

## 2014-03-17 ENCOUNTER — Other Ambulatory Visit: Payer: Self-pay | Admitting: *Deleted

## 2014-03-17 ENCOUNTER — Other Ambulatory Visit (HOSPITAL_COMMUNITY): Payer: Self-pay | Admitting: Obstetrics and Gynecology

## 2014-03-17 DIAGNOSIS — K432 Incisional hernia without obstruction or gangrene: Secondary | ICD-10-CM

## 2014-03-17 MED ORDER — MONTELUKAST SODIUM 10 MG PO TABS: 10.0000 mg | ORAL_TABLET | Freq: Every day | ORAL | Status: DC

## 2014-03-18 DIAGNOSIS — M818 Other osteoporosis without current pathological fracture: Secondary | ICD-10-CM | POA: Diagnosis not present

## 2014-03-22 ENCOUNTER — Ambulatory Visit (HOSPITAL_COMMUNITY)
Admission: RE | Admit: 2014-03-22 | Discharge: 2014-03-22 | Disposition: A | Discharge status: Home / Self Care | Payer: Medicare Other | Source: Ambulatory Visit | Attending: Obstetrics and Gynecology | Admitting: Obstetrics and Gynecology

## 2014-03-22 DIAGNOSIS — R109 Unspecified abdominal pain: Secondary | ICD-10-CM | POA: Diagnosis not present

## 2014-03-22 DIAGNOSIS — K432 Incisional hernia without obstruction or gangrene: Secondary | ICD-10-CM

## 2014-03-22 MED ORDER — IOHEXOL 300 MG/ML  SOLN
100.0000 mL | Freq: Once | INTRAMUSCULAR | Status: AC | PRN
  Administered 2014-03-22: 100 mL via INTRAVENOUS

## 2014-04-16 ENCOUNTER — Encounter: Payer: Self-pay | Admitting: Pulmonary Disease

## 2014-04-16 ENCOUNTER — Ambulatory Visit (INDEPENDENT_AMBULATORY_CARE_PROVIDER_SITE_OTHER): Payer: Medicare Other | Admitting: Pulmonary Disease

## 2014-04-16 VITALS — BP 132/88 | HR 92 | Temp 98.6°F | Ht 59.5 in | Wt 118.8 lb

## 2014-04-16 DIAGNOSIS — R059 Cough, unspecified: Secondary | ICD-10-CM

## 2014-04-16 DIAGNOSIS — J45990 Exercise induced bronchospasm: Secondary | ICD-10-CM

## 2014-04-16 DIAGNOSIS — J45909 Unspecified asthma, uncomplicated: Secondary | ICD-10-CM | POA: Diagnosis not present

## 2014-04-16 DIAGNOSIS — R058 Other specified cough: Secondary | ICD-10-CM

## 2014-04-16 DIAGNOSIS — J45998 Other asthma: Secondary | ICD-10-CM

## 2014-04-16 DIAGNOSIS — R05 Cough: Secondary | ICD-10-CM

## 2014-04-16 NOTE — Patient Instructions (Signed)
Follow up in 6 months 

## 2014-04-16 NOTE — Progress Notes (Signed)
Chief Complaint  Patient presents with  . Follow-up    No complaints. Pt stopped Advair x 3.5 weeks ago.    History of Present Illness: Kristine Cooper is a 68 y.o. female never smoker with dyspnea secondary to asthma.  She was getting throat irritation from using advair >> this improved after she stopped using advair.  She has been using singulair, claritin D, and nasal steroid spray.  These have helped.  Overall she is feeling better.  She still has trouble with dyspnea with exercise.  TESTS: Spirometry 07/07/08 >> FEV1 2.29 (118%), FEV1% 80, borderline BD from FEF 25-75%  Lt heart cath 07/16/13 >> Proximal LAD and RCA calcification, no coronary obstruction, normal LV function  Spirometry 10/16/13 >> FEV1 2.08 (107%), FEV1% Kristine Cooper  has a past medical history of Hypothyroidism.  Kristine Cooper  has past surgical history that includes Thyroidectomy, partial; Parathyroidectomy; and Lipoma excision.  Prior to Admission medications   Medication Sig Start Date End Date Taking? Authorizing Provider  albuterol (PROAIR HFA) 108 (90 BASE) MCG/ACT inhaler Inhale 2 puffs into the lungs every 6 (six) hours as needed for wheezing or shortness of breath.    Historical Provider, MD  aspirin 81 MG tablet Take 81 mg by mouth daily.    Historical Provider, MD  beclomethasone (QVAR) 80 MCG/ACT inhaler Inhale 2 puffs into the lungs 2 (two) times daily. 10/16/13   Chesley Mires, MD  levothyroxine (SYNTHROID, LEVOTHROID) 50 MCG tablet Take 50 mcg by mouth daily before breakfast.    Historical Provider, MD  Multiple Vitamin (MULTIVITAMIN) tablet Take 1 tablet by mouth daily.    Historical Provider, MD    Allergies  Allergen Reactions  . Codeine Sulfate Nausea And Vomiting  . Demerol [Meperidine] Nausea And Vomiting     Physical Exam:  General - No distress ENT - No sinus tenderness, no oral exudate, no LAN Cardiac - s1s2 regular, no murmur Chest - No wheeze/rales/dullness Back -  No focal tenderness Abd - Soft, non-tender Ext - No edema Neuro - Normal strength Skin - No rashes Psych - normal mood, and behavior   Assessment/Plan:  Chesley Mires, MD [facility] Pager:  [phone]

## 2014-04-21 NOTE — Assessment & Plan Note (Signed)
Discussed doing warm up routine to pre-condition her airways prior to exercising, and that she can also use proair as needed prior to exercising.

## 2014-04-21 NOTE — Assessment & Plan Note (Signed)
Improved with claritin and nasal steroids.  This has likely also helped to control her asthma.

## 2014-04-21 NOTE — Assessment & Plan Note (Signed)
She was intolerant of advair.  Will continue singulair, and prn albuterol.

## 2014-05-18 DIAGNOSIS — H04129 Dry eye syndrome of unspecified lacrimal gland: Secondary | ICD-10-CM | POA: Diagnosis not present

## 2014-05-18 DIAGNOSIS — H1045 Other chronic allergic conjunctivitis: Secondary | ICD-10-CM | POA: Diagnosis not present

## 2014-05-18 DIAGNOSIS — H47329 Drusen of optic disc, unspecified eye: Secondary | ICD-10-CM | POA: Diagnosis not present

## 2014-07-13 DIAGNOSIS — Z23 Encounter for immunization: Secondary | ICD-10-CM | POA: Diagnosis not present

## 2014-08-16 DIAGNOSIS — Z0001 Encounter for general adult medical examination with abnormal findings: Secondary | ICD-10-CM | POA: Diagnosis not present

## 2014-08-16 DIAGNOSIS — J449 Chronic obstructive pulmonary disease, unspecified: Secondary | ICD-10-CM | POA: Diagnosis not present

## 2014-08-16 DIAGNOSIS — G2581 Restless legs syndrome: Secondary | ICD-10-CM | POA: Diagnosis not present

## 2014-08-16 DIAGNOSIS — E200 Idiopathic hypoparathyroidism: Secondary | ICD-10-CM | POA: Diagnosis not present

## 2014-08-16 DIAGNOSIS — E780 Pure hypercholesterolemia: Secondary | ICD-10-CM | POA: Diagnosis not present

## 2014-08-16 DIAGNOSIS — M2570 Osteophyte, unspecified joint: Secondary | ICD-10-CM | POA: Diagnosis not present

## 2014-08-16 DIAGNOSIS — J309 Allergic rhinitis, unspecified: Secondary | ICD-10-CM | POA: Diagnosis not present

## 2014-08-16 DIAGNOSIS — E039 Hypothyroidism, unspecified: Secondary | ICD-10-CM | POA: Diagnosis not present

## 2014-08-25 DIAGNOSIS — M79672 Pain in left foot: Secondary | ICD-10-CM | POA: Diagnosis not present

## 2014-09-21 DIAGNOSIS — H00012 Hordeolum externum right lower eyelid: Secondary | ICD-10-CM | POA: Diagnosis not present

## 2014-09-23 DIAGNOSIS — M79672 Pain in left foot: Secondary | ICD-10-CM | POA: Diagnosis not present

## 2014-10-12 DIAGNOSIS — H10413 Chronic giant papillary conjunctivitis, bilateral: Secondary | ICD-10-CM | POA: Diagnosis not present

## 2014-10-12 DIAGNOSIS — H02125 Mechanical ectropion of left lower eyelid: Secondary | ICD-10-CM | POA: Diagnosis not present

## 2014-10-12 DIAGNOSIS — H02122 Mechanical ectropion of right lower eyelid: Secondary | ICD-10-CM | POA: Diagnosis not present

## 2014-10-25 DIAGNOSIS — H10413 Chronic giant papillary conjunctivitis, bilateral: Secondary | ICD-10-CM | POA: Diagnosis not present

## 2014-11-19 DIAGNOSIS — H66002 Acute suppurative otitis media without spontaneous rupture of ear drum, left ear: Secondary | ICD-10-CM | POA: Diagnosis not present

## 2014-11-19 DIAGNOSIS — J029 Acute pharyngitis, unspecified: Secondary | ICD-10-CM | POA: Diagnosis not present

## 2014-12-24 ENCOUNTER — Other Ambulatory Visit: Payer: Self-pay | Admitting: Dermatology

## 2014-12-24 DIAGNOSIS — L570 Actinic keratosis: Secondary | ICD-10-CM | POA: Diagnosis not present

## 2014-12-24 DIAGNOSIS — D1801 Hemangioma of skin and subcutaneous tissue: Secondary | ICD-10-CM | POA: Diagnosis not present

## 2014-12-24 DIAGNOSIS — L821 Other seborrheic keratosis: Secondary | ICD-10-CM | POA: Diagnosis not present

## 2014-12-24 DIAGNOSIS — D225 Melanocytic nevi of trunk: Secondary | ICD-10-CM | POA: Diagnosis not present

## 2014-12-24 DIAGNOSIS — L814 Other melanin hyperpigmentation: Secondary | ICD-10-CM | POA: Diagnosis not present

## 2014-12-24 DIAGNOSIS — L438 Other lichen planus: Secondary | ICD-10-CM | POA: Diagnosis not present

## 2015-01-11 ENCOUNTER — Other Ambulatory Visit: Payer: Self-pay

## 2015-01-11 DIAGNOSIS — Z1231 Encounter for screening mammogram for malignant neoplasm of breast: Secondary | ICD-10-CM

## 2015-02-01 DIAGNOSIS — M79643 Pain in unspecified hand: Secondary | ICD-10-CM | POA: Diagnosis not present

## 2015-02-17 ENCOUNTER — Ambulatory Visit
Admission: RE | Admit: 2015-02-17 | Discharge: 2015-02-17 | Disposition: A | Discharge status: Home / Self Care | Payer: Medicare Other | Source: Ambulatory Visit

## 2015-02-17 DIAGNOSIS — Z1231 Encounter for screening mammogram for malignant neoplasm of breast: Secondary | ICD-10-CM

## 2015-03-17 ENCOUNTER — Other Ambulatory Visit: Payer: Self-pay | Admitting: Obstetrics and Gynecology

## 2015-03-17 DIAGNOSIS — Z6825 Body mass index (BMI) 25.0-25.9, adult: Secondary | ICD-10-CM | POA: Diagnosis not present

## 2015-03-17 DIAGNOSIS — Z124 Encounter for screening for malignant neoplasm of cervix: Secondary | ICD-10-CM | POA: Diagnosis not present

## 2015-03-18 LAB — CYTOLOGY - PAP

## 2015-05-11 DIAGNOSIS — B009 Herpesviral infection, unspecified: Secondary | ICD-10-CM | POA: Diagnosis not present

## 2015-05-25 DIAGNOSIS — H04123 Dry eye syndrome of bilateral lacrimal glands: Secondary | ICD-10-CM | POA: Diagnosis not present

## 2015-05-25 DIAGNOSIS — H43813 Vitreous degeneration, bilateral: Secondary | ICD-10-CM | POA: Diagnosis not present

## 2015-05-25 DIAGNOSIS — H47323 Drusen of optic disc, bilateral: Secondary | ICD-10-CM | POA: Diagnosis not present

## 2015-05-25 DIAGNOSIS — H2513 Age-related nuclear cataract, bilateral: Secondary | ICD-10-CM | POA: Diagnosis not present

## 2015-06-17 DIAGNOSIS — B009 Herpesviral infection, unspecified: Secondary | ICD-10-CM | POA: Diagnosis not present

## 2015-07-04 DIAGNOSIS — H01022 Squamous blepharitis right lower eyelid: Secondary | ICD-10-CM | POA: Diagnosis not present

## 2015-07-04 DIAGNOSIS — H10413 Chronic giant papillary conjunctivitis, bilateral: Secondary | ICD-10-CM | POA: Diagnosis not present

## 2015-07-04 DIAGNOSIS — H01025 Squamous blepharitis left lower eyelid: Secondary | ICD-10-CM | POA: Diagnosis not present

## 2015-07-04 DIAGNOSIS — H04123 Dry eye syndrome of bilateral lacrimal glands: Secondary | ICD-10-CM | POA: Diagnosis not present

## 2015-07-06 DIAGNOSIS — Z23 Encounter for immunization: Secondary | ICD-10-CM | POA: Diagnosis not present

## 2015-07-13 DIAGNOSIS — R079 Chest pain, unspecified: Secondary | ICD-10-CM | POA: Diagnosis not present

## 2015-07-14 DIAGNOSIS — M7989 Other specified soft tissue disorders: Secondary | ICD-10-CM | POA: Diagnosis not present

## 2015-07-14 DIAGNOSIS — M19041 Primary osteoarthritis, right hand: Secondary | ICD-10-CM | POA: Diagnosis not present

## 2015-07-14 DIAGNOSIS — M79641 Pain in right hand: Secondary | ICD-10-CM | POA: Diagnosis not present

## 2015-07-14 DIAGNOSIS — M2550 Pain in unspecified joint: Secondary | ICD-10-CM | POA: Diagnosis not present

## 2015-07-14 DIAGNOSIS — M19042 Primary osteoarthritis, left hand: Secondary | ICD-10-CM | POA: Diagnosis not present

## 2015-07-14 DIAGNOSIS — M79642 Pain in left hand: Secondary | ICD-10-CM | POA: Diagnosis not present

## 2015-08-15 DIAGNOSIS — M65342 Trigger finger, left ring finger: Secondary | ICD-10-CM | POA: Diagnosis not present

## 2015-08-15 DIAGNOSIS — M151 Heberden's nodes (with arthropathy): Secondary | ICD-10-CM | POA: Diagnosis not present

## 2015-08-15 DIAGNOSIS — M152 Bouchard's nodes (with arthropathy): Secondary | ICD-10-CM | POA: Diagnosis not present

## 2015-08-30 ENCOUNTER — Other Ambulatory Visit: Payer: Self-pay | Admitting: Family Medicine

## 2015-08-30 DIAGNOSIS — R0602 Shortness of breath: Secondary | ICD-10-CM | POA: Diagnosis not present

## 2015-08-30 DIAGNOSIS — R0789 Other chest pain: Secondary | ICD-10-CM

## 2015-08-30 DIAGNOSIS — E039 Hypothyroidism, unspecified: Secondary | ICD-10-CM | POA: Diagnosis not present

## 2015-08-30 DIAGNOSIS — E786 Lipoprotein deficiency: Secondary | ICD-10-CM | POA: Diagnosis not present

## 2015-08-30 DIAGNOSIS — M810 Age-related osteoporosis without current pathological fracture: Secondary | ICD-10-CM | POA: Diagnosis not present

## 2015-08-30 DIAGNOSIS — J449 Chronic obstructive pulmonary disease, unspecified: Secondary | ICD-10-CM | POA: Diagnosis not present

## 2015-08-30 DIAGNOSIS — Z0000 Encounter for general adult medical examination without abnormal findings: Secondary | ICD-10-CM | POA: Diagnosis not present

## 2015-08-30 DIAGNOSIS — E200 Idiopathic hypoparathyroidism: Secondary | ICD-10-CM | POA: Diagnosis not present

## 2015-08-30 DIAGNOSIS — G2581 Restless legs syndrome: Secondary | ICD-10-CM | POA: Diagnosis not present

## 2015-09-01 ENCOUNTER — Ambulatory Visit
Admission: RE | Admit: 2015-09-01 | Discharge: 2015-09-01 | Disposition: A | Discharge status: Home / Self Care | Payer: BLUE CROSS/BLUE SHIELD | Source: Ambulatory Visit | Attending: Family Medicine | Admitting: Family Medicine

## 2015-09-01 DIAGNOSIS — R0602 Shortness of breath: Secondary | ICD-10-CM | POA: Diagnosis not present

## 2015-09-01 DIAGNOSIS — R0789 Other chest pain: Secondary | ICD-10-CM

## 2015-09-01 DIAGNOSIS — R079 Chest pain, unspecified: Secondary | ICD-10-CM | POA: Diagnosis not present

## 2015-09-01 MED ORDER — IOPAMIDOL (ISOVUE-370) INJECTION 76%
100.0000 mL | Freq: Once | INTRAVENOUS | Status: AC | PRN
  Administered 2015-09-01: 100 mL via INTRAVENOUS

## 2015-09-06 ENCOUNTER — Encounter: Payer: Self-pay | Admitting: Cardiology

## 2015-09-06 ENCOUNTER — Telehealth (HOSPITAL_COMMUNITY): Payer: Self-pay | Admitting: Radiology

## 2015-09-06 ENCOUNTER — Ambulatory Visit (INDEPENDENT_AMBULATORY_CARE_PROVIDER_SITE_OTHER): Payer: Medicare Other | Admitting: Cardiology

## 2015-09-06 ENCOUNTER — Telehealth (HOSPITAL_COMMUNITY): Payer: Self-pay | Admitting: *Deleted

## 2015-09-06 VITALS — BP 132/94 | HR 77 | Ht 59.5 in | Wt 124.8 lb

## 2015-09-06 DIAGNOSIS — R079 Chest pain, unspecified: Secondary | ICD-10-CM

## 2015-09-06 DIAGNOSIS — E785 Hyperlipidemia, unspecified: Secondary | ICD-10-CM

## 2015-09-06 DIAGNOSIS — R072 Precordial pain: Secondary | ICD-10-CM | POA: Diagnosis not present

## 2015-09-06 DIAGNOSIS — R030 Elevated blood-pressure reading, without diagnosis of hypertension: Secondary | ICD-10-CM | POA: Diagnosis not present

## 2015-09-06 DIAGNOSIS — IMO0001 Reserved for inherently not codable concepts without codable children: Secondary | ICD-10-CM

## 2015-09-06 NOTE — Telephone Encounter (Signed)
Left message on voicemail in reference to upcoming appointment scheduled for 09/08/15. Phone number given for a call back so details instructions can be given. Kristine Cooper, [name]

## 2015-09-06 NOTE — Progress Notes (Signed)
Cardiology Office Note   Date:  09/06/2015   ID:  Kristine Cooper, Kristine Cooper 03-25-1946, MRN [id number]  PCP:  Shirline Frees, MD  Cardiologist:  Dr. Tamala Julian    Chief Complaint  Patient presents with  . Chest Pain      History of Present Illness: Kristine Cooper is a 69 y.o. female who presents for evaluation of chest pain and recent abnormal EKG.  She has mid sternal pressure at times associated with SOB but no nausea or diaphoresis.  She has no pain with strenuous exercise.  Her EKG had mild ST depression in lead II, this may be lead placement.  She does have hyperlipidemia and family hx of CAD in her brother.   Today without pain, her BP is mildly elevated. She has a cough- followed by Dr. Kenton Kingfisher.  She recently had CTA neg for PE and no mention hr coronary arteries.      She has a hx of cardiac cath 2014 with non obstructive disease and systolic compression/bridging in the mid to distal LAD segment and normal LV function done for + stress test.    Past Medical History  Diagnosis Date  . Hypothyroidism     Multinodular goiter  . Abnormal stress test 07/16/2013  . Chest discomfort 07/16/2013  . Exercise-induced asthma 10/16/2013  . Uncontrolled persistent asthma 10/16/2013  . Upper airway cough syndrome 12/10/2013    Past Surgical History  Procedure Laterality Date  . Thyroidectomy, partial    . Parathyroidectomy      Partial  . Lipoma excision      From back     Current Outpatient Prescriptions  Medication Sig Dispense Refill  . Budesonide-Formoterol Fumarate (SYMBICORT IN) Inhale 2 puffs into the lungs 2 (two) times daily.    . calcitRIOL (ROCALTROL) 0.25 MCG capsule Take 1 capsule by mouth daily.    [name] levothyroxine (SYNTHROID, LEVOTHROID) 50 MCG tablet Take 50 mcg by mouth daily before breakfast.    . Loratadine-Pseudoephedrine (CLARITIN-D 24 HOUR PO) Take 1 tablet by mouth every morning.    . Magnesium 100 MG CAPS Take 400 mg by mouth daily.    . Multiple Vitamin  (MULTIVITAMIN) tablet Take 1 tablet by mouth daily.    [name] rOPINIRole (REQUIP) 0.5 MG tablet Take 0.25 mg by mouth at bedtime.    . vitamin E 100 UNIT capsule Take 400 Units by mouth daily.     No current facility-administered medications for this visit.    Allergies:   Codeine sulfate and Demerol    Social History:  The patient  reports that she has never smoked. She has never used smokeless tobacco. She reports that she does not drink alcohol or use illicit drugs.   Family History:  The patient's family history includes Cancer in her brother and father; Emphysema in her mother; Heart disease in her brother; Hyperlipidemia in her father; Liver disease in her father; Stroke in her sister.    ROS:  General:no colds or fevers, no weight changes Skin:no rashes or ulcers HEENT:no blurred vision, no congestion CV:see HPI PUL:see HPI GI:no diarrhea constipation or melena, no indigestion GU:no hematuria, no dysuria MS:no joint pain, no claudication Neuro:no syncope, no lightheadedness Endo:no diabetes, no thyroid disease  Wt Readings from Last 3 Encounters:  09/06/15 124 lb 12.8 oz (56.609 kg)  04/16/14 118 lb 12.8 oz (53.887 kg)  03/01/14 121 lb (54.885 kg)     PHYSICAL EXAM: VS:  BP 132/94 mmHg  Pulse 77  Ht 4'  11.5" (1.511 m)  Wt 124 lb 12.8 oz (56.609 kg)  BMI 24.79 kg/m2 , BMI Body mass index is 24.79 kg/(m^2). General:Pleasant affect, NAD Skin:Warm and dry, brisk capillary refill HEENT:normocephalic, sclera clear, mucus membranes moist Neck:supple, no JVD, no bruits  Heart:S1S2 RRR without murmur, gallup, rub or click Lungs:clear without rales, rhonchi, or wheezes VBT:YOMA, non tender, + BS, do not palpate liver spleen or masses Ext:no lower ext edema, 2+ pedal pulses, 2+ radial pulses Neuro:alert and oriented X 3, MAE, follows commands, + facial symmetry    EKG:  EKG is ordered today. The ekg ordered today demonstrates SR  No acute changes from 2014 and mild ST  depression in inf. Lead is resolved.     Recent Labs: No results found for requested labs within last 365 days.    Lipid Panel No results found for: CHOL, TRIG, HDL, CHOLHDL, VLDL, LDLCALC, LDLDIRECT     Other studies Reviewed: Additional studies/ records that were reviewed today include: previous cardiac cath.   ASSESSMENT AND PLAN:  1.  Chest pain in pt with previous nonobstructive disease hyperlipidemia and family hx CAD - will precede with exercise myoview with EKG that is abnormal at times.  Discussed with Dr. Acie Fredrickson.  Also if EF is lower would do echo at that time.  Follow up with Dr. Tamala Julian or with an app on day Dr. Tamala Julian available.   2. HTN she will monitor at home, on recheck it was 132/90.   3. Hyperlipidemia LDL > 120 and HDL > 100 if stress test + may be prudent to do lipomed to further eval cholesterol    Current medicines are reviewed with the patient today.  The patient Has no concerns regarding medicines.  The following changes have been made:  See above Labs/ tests ordered today include:see above  Disposition:   FU:  see above  Signed, Isaiah Serge, NP  09/06/2015 11:18 AM    [facility] [place], [place] Phone: [phone]; Fax: [phone]

## 2015-09-06 NOTE — Patient Instructions (Signed)
Medication Instructions:  None  Labwork: None  Testing/Procedures: Your physician has requested that you have en exercise stress myoview. For further information please visit [email or web address]. Please follow instruction sheet, as given.   Follow-Up: Your physician recommends that you schedule a follow-up appointment in: 1-2 weeks with an extender on a day that Dr. Tamala Julian is in the office.   Any Other Special Instructions Will Be Listed Below (If Applicable).     If you need a refill on your cardiac medications before your next appointment, please call your pharmacy.

## 2015-09-06 NOTE — Telephone Encounter (Signed)
Patient given detailed instructions per Myocardial Perfusion Study Information Sheet for the test on 09/08/2015 at 7:30. Patient notified to arrive 15 minutes early and that it is imperative to arrive on time for appointment to keep from having the test rescheduled.  If you need to cancel or reschedule your appointment, please call the office within 24 hours of your appointment. Failure to do so may result in a cancellation of your appointment, and a $50 no show fee. Patient verbalized understanding.EHK

## 2015-09-08 ENCOUNTER — Ambulatory Visit (HOSPITAL_COMMUNITY): Payer: Medicare Other | Attending: Internal Medicine

## 2015-09-08 DIAGNOSIS — R9439 Abnormal result of other cardiovascular function study: Secondary | ICD-10-CM | POA: Diagnosis not present

## 2015-09-08 DIAGNOSIS — R0602 Shortness of breath: Secondary | ICD-10-CM | POA: Diagnosis not present

## 2015-09-08 DIAGNOSIS — R072 Precordial pain: Secondary | ICD-10-CM | POA: Diagnosis not present

## 2015-09-08 LAB — MYOCARDIAL PERFUSION IMAGING
CHL CUP MPHR: 153 {beats}/min
CHL CUP NUCLEAR SDS: 6
CHL CUP NUCLEAR SRS: 6
CHL CUP RESTING HR STRESS: 71 {beats}/min
CHL RATE OF PERCEIVED EXERTION: 17
CSEPEDS: 31 s
Estimated workload: 7 METS
Exercise duration (min): 5 min
LV sys vol: 19 mL
LVDIAVOL: 60 mL
Peak HR: 139 {beats}/min
Percent HR: 91 %
RATE: 0.27
SSS: 12
TID: 0.98

## 2015-09-08 MED ORDER — TECHNETIUM TC 99M SESTAMIBI GENERIC - CARDIOLITE
10.1000 | Freq: Once | INTRAVENOUS | Status: AC | PRN
  Administered 2015-09-08: 10.1 via INTRAVENOUS

## 2015-09-08 MED ORDER — TECHNETIUM TC 99M SESTAMIBI GENERIC - CARDIOLITE
32.8000 | Freq: Once | INTRAVENOUS | Status: AC | PRN
  Administered 2015-09-08: 32.8 via INTRAVENOUS

## 2015-09-12 ENCOUNTER — Telehealth: Payer: Self-pay

## 2015-09-12 DIAGNOSIS — M151 Heberden's nodes (with arthropathy): Secondary | ICD-10-CM | POA: Diagnosis not present

## 2015-09-12 DIAGNOSIS — M65342 Trigger finger, left ring finger: Secondary | ICD-10-CM | POA: Diagnosis not present

## 2015-09-12 DIAGNOSIS — M152 Bouchard's nodes (with arthropathy): Secondary | ICD-10-CM | POA: Diagnosis not present

## 2015-09-12 NOTE — Telephone Encounter (Signed)
Follow up      Returning a call to [facility]

## 2015-09-12 NOTE — Telephone Encounter (Signed)
Pt aware of myoview results. Per Dr.Smith She has had a prior nuclear with cath following which revealed false positive perfusion images. If symptoms are progressive only answer is repeat cath. I could either see her for return OV or if she is concerned we should re-cath. Pt sts that she is currently asymptomatic. Her cath in Sept 2014 was clean. She does not want to proceed with a cardiac cath at this time. She will f/u as planned with Melina Copa, PA on 11/15

## 2015-09-12 NOTE — Telephone Encounter (Signed)
lmtcb on pt home #  Called pt cell. Pt sts that it was not a good time. She was at [employer] and she will call back

## 2015-09-12 NOTE — Telephone Encounter (Signed)
-----   Message from Belva Crome, MD sent at 09/10/2015  1:19 PM EDT ----- She has had a prior nuclear with cath following which revealed false positive perfusion images. If symptoms are progressive only answer is repeat cath. I could either see her for return OV or if she is concerned we should re-cath.

## 2015-09-19 ENCOUNTER — Ambulatory Visit: Payer: Medicare Other | Admitting: Interventional Cardiology

## 2015-09-20 ENCOUNTER — Encounter: Payer: Self-pay | Admitting: Physician Assistant

## 2015-09-20 ENCOUNTER — Ambulatory Visit (INDEPENDENT_AMBULATORY_CARE_PROVIDER_SITE_OTHER): Payer: Medicare Other | Admitting: Physician Assistant

## 2015-09-20 VITALS — BP 150/77 | HR 79 | Ht 59.5 in | Wt 121.0 lb

## 2015-09-20 DIAGNOSIS — R079 Chest pain, unspecified: Secondary | ICD-10-CM

## 2015-09-20 DIAGNOSIS — R9439 Abnormal result of other cardiovascular function study: Secondary | ICD-10-CM | POA: Diagnosis not present

## 2015-09-20 DIAGNOSIS — E785 Hyperlipidemia, unspecified: Secondary | ICD-10-CM

## 2015-09-20 DIAGNOSIS — I10 Essential (primary) hypertension: Secondary | ICD-10-CM

## 2015-09-20 DIAGNOSIS — E039 Hypothyroidism, unspecified: Secondary | ICD-10-CM | POA: Insufficient documentation

## 2015-09-20 DIAGNOSIS — I447 Left bundle-branch block, unspecified: Secondary | ICD-10-CM | POA: Insufficient documentation

## 2015-09-20 MED ORDER — AMLODIPINE BESYLATE 2.5 MG PO TABS: 2.5000 mg | ORAL_TABLET | Freq: Every day | ORAL | Status: DC

## 2015-09-20 NOTE — Progress Notes (Signed)
Cardiology Office Note Date:  09/20/2015  Patient ID:  Kristine Cooper, Kristine Cooper [birth year], MRN [id number] PCP:  Shirline Frees, MD  Cardiologist:  Dr. Linard Millers  Chief Complaint: f/u chest pain  History of Present Illness: Kristine Cooper is a 69 y.o. female with history of hypothyroidism, HLD, exercise-induced asthma, upper airway cough syndrome, false positive nuc in 2014, rate related LBBB by nuc 09/2015, recently elevated BP who presents for follow-up of recent chest pain. To recap history in 2014 she had an abnormal nuc which lead to a LHC 07/2013: moderate proximal LAD and RCA calcification without obstruction, widely patent cors, with systolic compression/bridging in the mid to distal LAD segment and normal EF.   She was evaluated by her PCP earlier this fall for chest discomfort. CTA 09/01/15 showed no PE; +subtle mosaic lung attenuation which could reflect areas of mild air trapping from small airways disease versus small vessel occlusive disease. Labs by PCP showed WBC normal, Hgb 14.1, Plt 281, TSH 0.66, Cr 0.68, Na 138, K 3.7, LFTs wnl, glucose 87, LDL 144, HDL 107. Her recent cough has been followed by Dr. Kenton Kingfisher. She was seen recently 09/06/15 by Cecilie Kicks NP for chest discomfort. BP was mildly elevated at 132/94. She underwent nuclear stress test 09/08/15 showing "Large size, moderate intensity reversible (SDS 6) anterior, anteroapical, septal and inferoseptal perfusion defect suggestive of ischemia. Findings could also be related to rate-dependent LBBB which developed during the stress portion of the study and terminated in recovery. LVEF is 68% with incoordinate septal motion. This is a high risk study (given location and extent of the perfusion defect)." This was reviewed by Dr. Tamala Julian - he noted that she had history of abnormal nuc and recommended cath only for progressive symptoms. The patient elected no cath for now.  She comes in today for follow-up. She still occasionally has chest  pain but it is quite atypical - happens sometimes when lying down at night while in the fetal position, or acutely when she bends over to tie her shoe. Symptoms are worse with emotional stress. She says the presidental election in particular has caused increased discomfort. She exercises daily and has not had any chest discomfort whatsoever with exertion. She has mild chronic DOE but this has been present for several years (even before prior cath) and has not changed at all recently. She remains concerned about her elevated BP.   Past Medical History  Diagnosis Date  . Hypothyroidism     Multinodular goiter  . Abnormal stress test 07/16/2013    a. in 2014 had abnormal nuc which lead to a LHC 07/2013: moderate proximal LAD and RCA calcification without obstruction, widely patent cors, with systolic compression/bridging in the mid to distal LAD segment, normal EF.  [name] Exercise-induced asthma 10/16/2013  . Uncontrolled persistent asthma 10/16/2013  . Upper airway cough syndrome 12/10/2013  . Hyperlipidemia   . LBBB (left bundle branch block)     a. Rate related LBBB noted on nuc 09/2015.    Past Surgical History  Procedure Laterality Date  . Thyroidectomy, partial    . Parathyroidectomy      Partial  . Lipoma excision      From back    Current Outpatient Prescriptions  Medication Sig Dispense Refill  . Budesonide-Formoterol Fumarate (SYMBICORT IN) Inhale 2 puffs into the lungs 2 (two) times daily.    [name] levothyroxine (SYNTHROID, LEVOTHROID) 50 MCG tablet Take 50 mcg by mouth daily before breakfast.    .  Loratadine-Pseudoephedrine (CLARITIN-D 24 HOUR PO) Take 1 tablet by mouth every morning.    . Magnesium 100 MG CAPS Take 400 mg by mouth daily.    . Multiple Vitamin (MULTIVITAMIN) tablet Take 1 tablet by mouth daily.    [name] rOPINIRole (REQUIP) 0.5 MG tablet Take 0.25 mg by mouth at bedtime.    . vitamin E 100 UNIT capsule Take 400 Units by mouth daily.     No current facility-administered  medications for this visit.    Allergies:   Codeine sulfate and Demerol   Social History:  The patient  reports that she has never smoked. She has never used smokeless tobacco. She reports that she does not drink alcohol or use illicit drugs.   Family History:  The patient's family history includes Cancer in her brother and father; Emphysema in her mother; Heart attack in her brother; Heart disease (age of onset: 61) in her brother; Hyperlipidemia in her father; Liver disease in her father; Stroke in her sister. There is no history of Hypertension.  ROS:  Please see the history of present illness.  All other systems are reviewed and otherwise negative.   PHYSICAL EXAM:  VS:  BP 150/77 mmHg  Pulse 79  Ht 4' 11.5" (1.511 m)  Wt 121 lb (54.885 kg)  BMI 24.04 kg/m2 BMI: Body mass index is 24.04 kg/(m^2). Well nourished, well developed WF in no acute distress HEENT: normocephalic, atraumatic Neck: no JVD, carotid bruits or masses Cardiac:  normal S1, S2; RRR; no murmurs, rubs, or gallops Lungs:  clear to auscultation bilaterally, no wheezing, rhonchi or rales Abd: soft, nontender, no hepatomegaly, + BS MS: no deformity or atrophy Ext: no edema Skin: warm and dry, no rash Neuro:  moves all extremities spontaneously, no focal abnormalities noted, follows commands Psych: euthymic mood, full affect   EKG:  Done today shows NSR 79bpm, prior septal infarct, nonspecific ST-T changes (subtle ST sagging inferiorly and V6) - no significant change from previous  Recent Labs: No results found for requested labs within last 365 days.  No results found for requested labs within last 365 days.   CrCl cannot be calculated (Patient has no serum creatinine result on file.).   Wt Readings from Last 3 Encounters:  09/20/15 121 lb (54.885 kg)  09/08/15 124 lb (56.246 kg)  09/06/15 124 lb 12.8 oz (56.609 kg)     Other studies reviewed: Additional studies/records reviewed today include: summarized  above  ASSESSMENT AND PLAN:  1. Chest pain with recent abnormal stress test - chest pain is quite atypical. She has h/o false positive exercise nuc in 2014 leading to unrevealing cardiac catheterization as above. Recent nuclear stress test was reviewed by Dr. Tamala Julian who did not feel cath was warranted at this time unless she had progressive symptoms. Today we discussed that a cath is the only way to know for sure - she continues to prefer to avoid cath if possible. Will see if treating her BP helps with her symptoms (especially since they tend to occur with increased emotional stress, which could drive BP up). Will try amlodipine in case there is any component of coronary vasospasm. If symptoms persist, change or worsen, I advised patient to let us know at which time we could reconsider cath for definitive evaluation. We briefly discussed trying PPI (especially given recent cough issues) but she prefers to hold off and discuss with PCP. I asked her to give [place] a call back in 2 weeks to give [place] update on  her symptoms and BP.  2. Hyperlipidemia - lipids are followed by PCP. She says her PCP prefers not to treat with medication at present time given her LDL/HDL ratio. 3. HTN - will start amlodipine 2.5mg  daily. If BP is controlled when she calls back to check in in 2 weeks, she'll need refills sent in for 90 days to her mail order pharmacy.   Disposition: F/u with Dr. Tamala Julian in 6 months, sooner if needed.  Current medicines are reviewed at length with the patient today.  The patient did not have any concerns regarding medicines.  Raechel Ache PA-C 09/20/2015 3:22 PM     [address] [place] [address] [phone] (office)  [phone] (fax)

## 2015-09-20 NOTE — Patient Instructions (Addendum)
Medication Instructions:  Your physician has recommended you make the following change in your medication:  1.  START the Amlodipine 2.5mg  taking 1 tablet daily (Call us in about 2 weeks and let us know how this is working and if this is, then we will call in a 90 day supply to your mail in pharmacy)   Labwork: None ordered  Testing/Procedures: None ordered  Follow-Up: Your physician wants you to follow-up in: [place].  You will receive a reminder letter in the mail two months in advance (March).  Once you get the letter, go ahead an call and get on his schedule for May 2017.  If you don't receive a letter, please call our office to schedule the follow-up appointment.     Any Other Special Instructions Will Be Listed Below (If Applicable).  Call our office [phone] in 2 weeks to update [place] on your symptoms and your blood pressure.   If you need a refill on your cardiac medications before your next appointment, please call your pharmacy.

## 2015-09-22 ENCOUNTER — Ambulatory Visit: Payer: Medicare Other | Admitting: Physician Assistant

## 2015-09-22 ENCOUNTER — Other Ambulatory Visit: Payer: Self-pay | Admitting: Family Medicine

## 2015-09-22 ENCOUNTER — Ambulatory Visit
Admission: RE | Admit: 2015-09-22 | Discharge: 2015-09-22 | Disposition: A | Discharge status: Home / Self Care | Payer: BLUE CROSS/BLUE SHIELD | Source: Ambulatory Visit | Attending: Family Medicine | Admitting: Family Medicine

## 2015-09-22 DIAGNOSIS — S2020XA Contusion of thorax, unspecified, initial encounter: Secondary | ICD-10-CM | POA: Diagnosis not present

## 2015-09-22 DIAGNOSIS — S2232XA Fracture of one rib, left side, initial encounter for closed fracture: Secondary | ICD-10-CM | POA: Diagnosis not present

## 2015-09-28 ENCOUNTER — Encounter: Payer: Self-pay | Admitting: Physician Assistant

## 2015-10-05 ENCOUNTER — Telehealth: Payer: Self-pay | Admitting: Interventional Cardiology

## 2015-10-05 MED ORDER — AMLODIPINE BESYLATE 2.5 MG PO TABS: 2.5000 mg | ORAL_TABLET | Freq: Every day | ORAL | Status: DC

## 2015-10-05 NOTE — Telephone Encounter (Signed)
Routed to [facility] to call pt

## 2015-10-05 NOTE — Telephone Encounter (Signed)
Note below is from patient's office visit on 09/20/2015  "Medication Instructions:  Your physician has recommended you make the following change in your medication:  1. START the Amlodipine 2.5mg  taking 1 tablet daily (Call us in about 2 weeks and let us know how this is working and if this is, then we will call in a 90 day supply to your mail in pharmacy)"  Patient stated she was doing fine with Amlodipine 2.5 mg by mouth daily, as long as she eats a decent breakfast. She stated that several mornings she had hardly eaten anything for breakfast and felt lightheaded. Encouraged patient to make sure she eats a healthy breakfast and to call us if this happen more frequently. Patient's report of BP and HR are in normal range. Sent 90 day supply to [facility] at patient's request. Will forward to [facility] PA, so she is aware.

## 2015-10-05 NOTE — Telephone Encounter (Signed)
Agree. Thanks! Dayna Dunn PA-C

## 2015-10-05 NOTE — Telephone Encounter (Signed)
New message     Calling to give an update on her condition.  Dayna wanted her to call back.

## 2015-10-11 ENCOUNTER — Telehealth: Payer: Self-pay | Admitting: Interventional Cardiology

## 2015-10-11 MED ORDER — AMLODIPINE BESYLATE 2.5 MG PO TABS: 2.5000 mg | ORAL_TABLET | Freq: Every day | ORAL | Status: DC

## 2015-10-11 NOTE — Telephone Encounter (Signed)
Returned pt call. [facility]mom Rx for Amlodipine e-scribed to [facility] signed by Dr.Smith

## 2015-10-11 NOTE — Telephone Encounter (Signed)
New message   Pt is calling because PA signed for medication for [facility]   But the company states it has to be signed by a Cardiologist   Pt would like rn to get a fax that may come in

## 2015-10-12 NOTE — Telephone Encounter (Signed)
Returned pt call. Adv her that Rx was escribed to her pharmacy on 12/6 Pt voiced appreciation and verbalized understanding

## 2015-10-12 NOTE — Telephone Encounter (Signed)
Follow up   Pt is returning call    

## 2015-10-13 ENCOUNTER — Other Ambulatory Visit: Payer: Self-pay | Admitting: *Deleted

## 2015-10-13 MED ORDER — AMLODIPINE BESYLATE 2.5 MG PO TABS: 2.5000 mg | ORAL_TABLET | Freq: Every day | ORAL | Status: DC

## 2015-11-18 DIAGNOSIS — S2232XD Fracture of one rib, left side, subsequent encounter for fracture with routine healing: Secondary | ICD-10-CM | POA: Diagnosis not present

## 2015-12-07 ENCOUNTER — Other Ambulatory Visit: Payer: Self-pay | Admitting: Family Medicine

## 2015-12-07 ENCOUNTER — Ambulatory Visit
Admission: RE | Admit: 2015-12-07 | Discharge: 2015-12-07 | Disposition: A | Discharge status: Home / Self Care | Payer: BLUE CROSS/BLUE SHIELD | Source: Ambulatory Visit | Attending: Family Medicine | Admitting: Family Medicine

## 2015-12-07 DIAGNOSIS — S2232XG Fracture of one rib, left side, subsequent encounter for fracture with delayed healing: Secondary | ICD-10-CM | POA: Diagnosis not present

## 2015-12-07 DIAGNOSIS — S2232XA Fracture of one rib, left side, initial encounter for closed fracture: Secondary | ICD-10-CM | POA: Diagnosis not present

## 2016-01-04 DIAGNOSIS — H10413 Chronic giant papillary conjunctivitis, bilateral: Secondary | ICD-10-CM | POA: Diagnosis not present

## 2016-01-04 DIAGNOSIS — H04123 Dry eye syndrome of bilateral lacrimal glands: Secondary | ICD-10-CM | POA: Diagnosis not present

## 2016-01-04 DIAGNOSIS — H01025 Squamous blepharitis left lower eyelid: Secondary | ICD-10-CM | POA: Diagnosis not present

## 2016-01-04 DIAGNOSIS — H01022 Squamous blepharitis right lower eyelid: Secondary | ICD-10-CM | POA: Diagnosis not present

## 2016-01-10 ENCOUNTER — Other Ambulatory Visit: Payer: Self-pay

## 2016-01-10 DIAGNOSIS — Z1231 Encounter for screening mammogram for malignant neoplasm of breast: Secondary | ICD-10-CM

## 2016-02-07 DIAGNOSIS — L820 Inflamed seborrheic keratosis: Secondary | ICD-10-CM | POA: Diagnosis not present

## 2016-02-20 ENCOUNTER — Ambulatory Visit: Payer: Medicare Other

## 2016-02-20 ENCOUNTER — Ambulatory Visit
Admission: RE | Admit: 2016-02-20 | Discharge: 2016-02-20 | Disposition: A | Discharge status: Home / Self Care | Payer: Medicare Other | Source: Ambulatory Visit

## 2016-02-20 DIAGNOSIS — Z1231 Encounter for screening mammogram for malignant neoplasm of breast: Secondary | ICD-10-CM | POA: Diagnosis not present

## 2016-02-24 DIAGNOSIS — M65342 Trigger finger, left ring finger: Secondary | ICD-10-CM | POA: Diagnosis not present

## 2016-02-24 DIAGNOSIS — M6530 Trigger finger, unspecified finger: Secondary | ICD-10-CM | POA: Insufficient documentation

## 2016-02-27 ENCOUNTER — Encounter (HOSPITAL_BASED_OUTPATIENT_CLINIC_OR_DEPARTMENT_OTHER): Payer: Self-pay | Admitting: *Deleted

## 2016-02-28 ENCOUNTER — Other Ambulatory Visit: Payer: Self-pay | Admitting: Orthopedic Surgery

## 2016-02-28 DIAGNOSIS — J449 Chronic obstructive pulmonary disease, unspecified: Secondary | ICD-10-CM | POA: Diagnosis not present

## 2016-02-28 DIAGNOSIS — J309 Allergic rhinitis, unspecified: Secondary | ICD-10-CM | POA: Diagnosis not present

## 2016-02-28 DIAGNOSIS — E200 Idiopathic hypoparathyroidism: Secondary | ICD-10-CM | POA: Diagnosis not present

## 2016-02-28 DIAGNOSIS — E039 Hypothyroidism, unspecified: Secondary | ICD-10-CM | POA: Diagnosis not present

## 2016-02-28 DIAGNOSIS — I10 Essential (primary) hypertension: Secondary | ICD-10-CM | POA: Diagnosis not present

## 2016-02-28 DIAGNOSIS — G2581 Restless legs syndrome: Secondary | ICD-10-CM | POA: Diagnosis not present

## 2016-02-28 DIAGNOSIS — E7800 Pure hypercholesterolemia, unspecified: Secondary | ICD-10-CM | POA: Diagnosis not present

## 2016-03-19 DIAGNOSIS — L84 Corns and callosities: Secondary | ICD-10-CM | POA: Diagnosis not present

## 2016-03-19 DIAGNOSIS — L309 Dermatitis, unspecified: Secondary | ICD-10-CM | POA: Diagnosis not present

## 2016-03-20 ENCOUNTER — Ambulatory Visit (HOSPITAL_BASED_OUTPATIENT_CLINIC_OR_DEPARTMENT_OTHER): Payer: Medicare Other | Admitting: Anesthesiology

## 2016-03-20 ENCOUNTER — Encounter (HOSPITAL_BASED_OUTPATIENT_CLINIC_OR_DEPARTMENT_OTHER): Payer: Self-pay | Admitting: Orthopedic Surgery

## 2016-03-20 ENCOUNTER — Encounter (HOSPITAL_BASED_OUTPATIENT_CLINIC_OR_DEPARTMENT_OTHER)
Admission: RE | Disposition: A | Discharge status: Home / Self Care | Payer: Self-pay | Source: Ambulatory Visit | Attending: Orthopedic Surgery

## 2016-03-20 ENCOUNTER — Ambulatory Visit (HOSPITAL_BASED_OUTPATIENT_CLINIC_OR_DEPARTMENT_OTHER)
Admission: RE | Admit: 2016-03-20 | Discharge: 2016-03-20 | Disposition: A | Discharge status: Home / Self Care | Payer: Medicare Other | Source: Ambulatory Visit | Attending: Orthopedic Surgery | Admitting: Orthopedic Surgery

## 2016-03-20 DIAGNOSIS — M65842 Other synovitis and tenosynovitis, left hand: Secondary | ICD-10-CM | POA: Diagnosis not present

## 2016-03-20 DIAGNOSIS — M659 Synovitis and tenosynovitis, unspecified: Secondary | ICD-10-CM | POA: Diagnosis not present

## 2016-03-20 DIAGNOSIS — I10 Essential (primary) hypertension: Secondary | ICD-10-CM | POA: Insufficient documentation

## 2016-03-20 DIAGNOSIS — M65342 Trigger finger, left ring finger: Secondary | ICD-10-CM | POA: Diagnosis not present

## 2016-03-20 DIAGNOSIS — M65341 Trigger finger, right ring finger: Secondary | ICD-10-CM | POA: Diagnosis not present

## 2016-03-20 HISTORY — PX: TRIGGER FINGER RELEASE: SHX641

## 2016-03-20 HISTORY — DX: Essential (primary) hypertension: I10

## 2016-03-20 SURGERY — RELEASE, A1 PULLEY, FOR TRIGGER FINGER
Anesthesia: Monitor Anesthesia Care | Site: Finger | Laterality: Left

## 2016-03-20 MED ORDER — GLYCOPYRROLATE 0.2 MG/ML IJ SOLN: 0.2000 mg | Freq: Once | INTRAMUSCULAR | Status: DC | PRN

## 2016-03-20 MED ORDER — MIDAZOLAM HCL 2 MG/2ML IJ SOLN: 1.0000 mg | INTRAMUSCULAR | Status: DC | PRN

## 2016-03-20 MED ORDER — FENTANYL CITRATE (PF) 100 MCG/2ML IJ SOLN: 25.0000 ug | INTRAMUSCULAR | Status: DC | PRN

## 2016-03-20 MED ORDER — PROPOFOL 500 MG/50ML IV EMUL
INTRAVENOUS | Status: DC | PRN
  Administered 2016-03-20: 25 ug/kg/min via INTRAVENOUS

## 2016-03-20 MED ORDER — FENTANYL CITRATE (PF) 100 MCG/2ML IJ SOLN
INTRAMUSCULAR | Status: DC | PRN
  Administered 2016-03-20: 100 ug via INTRAVENOUS

## 2016-03-20 MED ORDER — FENTANYL CITRATE (PF) 100 MCG/2ML IJ SOLN: 50.0000 ug | INTRAMUSCULAR | Status: DC | PRN

## 2016-03-20 MED ORDER — LIDOCAINE HCL (PF) 1 % IJ SOLN
INTRAMUSCULAR | Status: AC
  Filled 2016-03-20: qty 60

## 2016-03-20 MED ORDER — BUPIVACAINE HCL (PF) 0.25 % IJ SOLN
INTRAMUSCULAR | Status: AC
  Filled 2016-03-20: qty 120

## 2016-03-20 MED ORDER — CHLORHEXIDINE GLUCONATE 4 % EX LIQD: 60.0000 mL | Freq: Once | CUTANEOUS | Status: DC

## 2016-03-20 MED ORDER — CEFAZOLIN SODIUM-DEXTROSE 2-4 GM/100ML-% IV SOLN: 2.0000 g | INTRAVENOUS | Status: DC

## 2016-03-20 MED ORDER — ACETAMINOPHEN 160 MG/5ML PO SOLN: 325.0000 mg | ORAL | Status: DC | PRN

## 2016-03-20 MED ORDER — PROPOFOL 10 MG/ML IV BOLUS
INTRAVENOUS | Status: AC
  Filled 2016-03-20: qty 40

## 2016-03-20 MED ORDER — MIDAZOLAM HCL 5 MG/5ML IJ SOLN
INTRAMUSCULAR | Status: DC | PRN
  Administered 2016-03-20: 2 mg via INTRAVENOUS

## 2016-03-20 MED ORDER — SCOPOLAMINE 1 MG/3DAYS TD PT72: 1.0000 | MEDICATED_PATCH | Freq: Once | TRANSDERMAL | Status: DC | PRN

## 2016-03-20 MED ORDER — OXYCODONE HCL 5 MG/5ML PO SOLN: 5.0000 mg | Freq: Once | ORAL | Status: DC | PRN

## 2016-03-20 MED ORDER — LIDOCAINE HCL (PF) 0.5 % IJ SOLN
INTRAMUSCULAR | Status: DC | PRN
  Administered 2016-03-20: 30 mL via INTRAVENOUS

## 2016-03-20 MED ORDER — LACTATED RINGERS IV SOLN
INTRAVENOUS | Status: DC
  Administered 2016-03-20: 08:00:00 via INTRAVENOUS

## 2016-03-20 MED ORDER — TRAMADOL HCL 50 MG PO TABS: 50.0000 mg | ORAL_TABLET | Freq: Four times a day (QID) | ORAL | Status: DC | PRN

## 2016-03-20 MED ORDER — BUPIVACAINE HCL (PF) 0.25 % IJ SOLN
INTRAMUSCULAR | Status: DC | PRN
  Administered 2016-03-20: 5 mL

## 2016-03-20 MED ORDER — ONDANSETRON HCL 4 MG/2ML IJ SOLN
INTRAMUSCULAR | Status: AC
  Filled 2016-03-20: qty 2

## 2016-03-20 MED ORDER — ONDANSETRON HCL 4 MG/2ML IJ SOLN
INTRAMUSCULAR | Status: DC | PRN
  Administered 2016-03-20: 4 mg via INTRAVENOUS

## 2016-03-20 MED ORDER — FENTANYL CITRATE (PF) 100 MCG/2ML IJ SOLN
INTRAMUSCULAR | Status: AC
  Filled 2016-03-20: qty 2

## 2016-03-20 MED ORDER — OXYCODONE HCL 5 MG PO TABS: 5.0000 mg | ORAL_TABLET | Freq: Once | ORAL | Status: DC | PRN

## 2016-03-20 MED ORDER — LACTATED RINGERS IV SOLN
INTRAVENOUS | Status: DC | PRN
  Administered 2016-03-20: 08:00:00 via INTRAVENOUS

## 2016-03-20 MED ORDER — ACETAMINOPHEN 325 MG PO TABS: 325.0000 mg | ORAL_TABLET | ORAL | Status: DC | PRN

## 2016-03-20 MED ORDER — MIDAZOLAM HCL 2 MG/2ML IJ SOLN
INTRAMUSCULAR | Status: AC
  Filled 2016-03-20: qty 2

## 2016-03-20 MED ORDER — DEXAMETHASONE SODIUM PHOSPHATE 10 MG/ML IJ SOLN
INTRAMUSCULAR | Status: AC
  Filled 2016-03-20: qty 1

## 2016-03-20 MED ORDER — LIDOCAINE 2% (20 MG/ML) 5 ML SYRINGE
INTRAMUSCULAR | Status: AC
  Filled 2016-03-20: qty 5

## 2016-03-20 SURGICAL SUPPLY — 33 items
BANDAGE COBAN STERILE 2 (GAUZE/BANDAGES/DRESSINGS) ×2 IMPLANT
BLADE SURG 15 STRL LF DISP TIS (BLADE) ×1 IMPLANT
BLADE SURG 15 STRL SS (BLADE) ×1
BNDG CMPR 9X4 STRL LF SNTH (GAUZE/BANDAGES/DRESSINGS) ×1
BNDG ESMARK 4X9 LF (GAUZE/BANDAGES/DRESSINGS) ×2 IMPLANT
CHLORAPREP W/TINT 26ML (MISCELLANEOUS) ×2 IMPLANT
CORDS BIPOLAR (ELECTRODE) IMPLANT
COVER BACK TABLE 60X90IN (DRAPES) ×2 IMPLANT
COVER MAYO STAND STRL (DRAPES) ×2 IMPLANT
CUFF TOURNIQUET SINGLE 18IN (TOURNIQUET CUFF) ×2 IMPLANT
DECANTER SPIKE VIAL GLASS SM (MISCELLANEOUS) IMPLANT
DRAPE EXTREMITY T 121X128X90 (DRAPE) ×2 IMPLANT
DRAPE SURG 17X23 STRL (DRAPES) ×2 IMPLANT
GAUZE SPONGE 4X4 12PLY STRL (GAUZE/BANDAGES/DRESSINGS) ×2 IMPLANT
GAUZE XEROFORM 1X8 LF (GAUZE/BANDAGES/DRESSINGS) ×2 IMPLANT
GLOVE BIOGEL PI IND STRL 7.0 (GLOVE) ×2 IMPLANT
GLOVE BIOGEL PI IND STRL 8.5 (GLOVE) ×1 IMPLANT
GLOVE BIOGEL PI INDICATOR 7.0 (GLOVE) ×2
GLOVE BIOGEL PI INDICATOR 8.5 (GLOVE) ×1
GLOVE ECLIPSE 6.5 STRL STRAW (GLOVE) ×2 IMPLANT
GLOVE SURG ORTHO 8.0 STRL STRW (GLOVE) ×2 IMPLANT
GOWN STRL REUS W/ TWL LRG LVL3 (GOWN DISPOSABLE) ×1 IMPLANT
GOWN STRL REUS W/TWL LRG LVL3 (GOWN DISPOSABLE) ×1
GOWN STRL REUS W/TWL XL LVL3 (GOWN DISPOSABLE) ×2 IMPLANT
NEEDLE PRECISIONGLIDE 27X1.5 (NEEDLE) ×2 IMPLANT
NS IRRIG 1000ML POUR BTL (IV SOLUTION) ×2 IMPLANT
PACK BASIN DAY SURGERY FS (CUSTOM PROCEDURE TRAY) ×2 IMPLANT
STOCKINETTE 4X48 STRL (DRAPES) ×2 IMPLANT
SUT ETHILON 4 0 PS 2 18 (SUTURE) ×4 IMPLANT
SYR BULB 3OZ (MISCELLANEOUS) ×2 IMPLANT
SYR CONTROL 10ML LL (SYRINGE) ×2 IMPLANT
TOWEL OR 17X24 6PK STRL BLUE (TOWEL DISPOSABLE) ×4 IMPLANT
UNDERPAD 30X30 (UNDERPADS AND DIAPERS) ×2 IMPLANT

## 2016-03-20 NOTE — Anesthesia Postprocedure Evaluation (Signed)
Anesthesia Post Note  Patient: Kristine Cooper  Procedure(s) Performed: Procedure(s) (LRB): RELEASE A-1 PULLEY LEFT RING FINGER (Left)  Patient location during evaluation: PACU Anesthesia Type: MAC and Bier Block Level of consciousness: awake Pain management: pain level controlled Vital Signs Assessment: post-procedure vital signs reviewed and stable Respiratory status: spontaneous breathing Cardiovascular status: stable Postop Assessment: no signs of nausea or vomiting Anesthetic complications: no    Last Vitals:  Filed Vitals:   03/20/16 0915 03/20/16 0930  BP: 139/84 126/81  Pulse: 95 66  Temp:    Resp: 12 12    Last Pain:  Filed Vitals:   03/20/16 0955  PainSc: 0-No pain                 Kayna Suppa

## 2016-03-20 NOTE — Op Note (Signed)
NAMEJANALEE, PROFFER               ACCOUNT NO.:  [id number]  MEDICAL RECORD NO.:  [id number]  LOCATION:                                 FACILITY:  PHYSICIAN:  Daryll Brod, M.D.            DATE OF BIRTH:  DATE OF PROCEDURE:  03/20/2016 DATE OF DISCHARGE:                              OPERATIVE REPORT   PREOPERATIVE DIAGNOSIS:  Stenosing tenosynovitis, trigger, left ring finger.  POSTOPERATIVE DIAGNOSIS:  Stenosing tenosynovitis, trigger, left ring finger.  OPERATION:  Release of A1 pulley, left ring finger.  SURGEON:  Daryll Brod, M.D.  ANESTHESIA:  Forearm-based IV regional with local infiltration.  PLACE OF SURGERY:  [facility].  HISTORY:  The patient is a 70 year old female with history of triggering of the left ring finger.  This has been injected on multiple occasions without relief.  She is elected to undergo surgical release of the A1 pulley.  Pre, peri, and postoperative course have been discussed along with risks and complications.  She is aware that there was no guarantee with the surgery; the possibility of infection; recurrence of injury to arteries, nerves, tendons; incomplete relief of symptoms; dystrophy.  In the preoperative area, the patient is seen, the extremity marked by both patient and surgeon and antibiotic given.  PROCEDURE IN DETAIL:  The patient was brought to the operating room where a forearm-based IV regional anesthetic was carried out without difficulty.  She was prepped using ChloraPrep, supine position with the left arm free.  A 3-minute dry time was allowed.  Time-out taken, confirming the patient and procedure.  An oblique incision was made over the A1 pulley of left ring finger, carried down through the subcutaneous tissue.  Bleeders were electrocauterized with bipolar.  The A1 pulley was identified.  Retractor was placed protecting the neurovascular bundles radially and ulnarly.  The A1 pulley was found to be markedly thickened.   This was incised on its radial aspect with sharp dissection. The retractor was placed distally and the two pulley was incised in the central aspect to allow a smooth transition.  Tenosynovial tissue proximally was separated, the two tendons were then widely separated to prevent any adherence between the two tendons.  The finger was placed through full passive mobility and no further triggering was noted.  The wound was irrigated with saline and then closed with interrupted 4-0 nylon sutures.  A local infiltration with 0.25% bupivacaine without epinephrine was given, approximately 5 mL was used. A sterile compressive dressing with the fingers free was applied.  On deflation of the tourniquet, all fingers were immediately pinked.  She was taken to the recovery room for observation in satisfactory condition.  She will be discharged to home to return to the [facility] in 1 week, on [facility].          ______________________________ Daryll Brod, M.D.     GK/MEDQ  D:  03/20/2016  T:  03/20/2016  Job:  [id number]

## 2016-03-20 NOTE — Brief Op Note (Signed)
03/20/2016  8:59 AM  PATIENT:  Kristine Cooper  70 y.o. female  PRE-OPERATIVE DIAGNOSIS:  CHRONIC STENOSING TENOSYNOVITIS LEFT RING FINGER  POST-OPERATIVE DIAGNOSIS:  CHRONIC STENOSING TENOSYNOVITIS LEFT RING FINGER  PROCEDURE:  Procedure(s) with comments: RELEASE A-1 PULLEY LEFT RING FINGER (Left) - ANESTHESIA: IV REGIONAL FAB  SURGEON:  Surgeon(s) and Role:    * Daryll Brod, MD - Primary  PHYSICIAN ASSISTANT:   ASSISTANTS: none   ANESTHESIA:   local and regional  EBL:     BLOOD ADMINISTERED:none  DRAINS: none   LOCAL MEDICATIONS USED:  BUPIVICAINE   SPECIMEN:  No Specimen  DISPOSITION OF SPECIMEN:  N/A  COUNTS:  YES  TOURNIQUET:   Total Tourniquet Time Documented: Forearm (Left) - 17 minutes Total: Forearm (Left) - 17 minutes   DICTATION: .Other Dictation: Dictation Number [id number]  PLAN OF CARE: Discharge to home after PACU  PATIENT DISPOSITION:  PACU - hemodynamically stable.

## 2016-03-20 NOTE — Op Note (Signed)
Dictation Number [id number]

## 2016-03-20 NOTE — Anesthesia Procedure Notes (Signed)
Procedure Name: MAC Date/Time: 03/20/2016 8:34 AM Performed by: Marrianne Mood Pre-anesthesia Checklist: Patient identified, Timeout performed, Emergency Drugs available, Suction available and Patient being monitored Patient Re-evaluated:Patient Re-evaluated prior to inductionOxygen Delivery Method: Simple face mask

## 2016-03-20 NOTE — H&P (Signed)
  Kristine Cooper is an 70 y.o. female.   Chief Complaint: catching left ring finger HPI Kristine Cooper a 70 yo female with catching of her left ring finger. This has been injected multiple times but continues to catch. She is not diabetic.   Past Medical History  Diagnosis Date  . Hypothyroidism     Multinodular goiter  . Abnormal stress test 07/16/2013    a. in 2014 had abnormal nuc which lead to a LHC 07/2013: moderate proximal LAD and RCA calcification without obstruction, widely patent cors, with systolic compression/bridging in the mid to distal LAD segment, normal EF.  [name] Upper airway cough syndrome 12/10/2013  . Hyperlipidemia   . LBBB (left bundle branch block)     a. Rate related LBBB noted on nuc 09/2015.  [name] Hypertension   . Exercise-induced asthma 10/16/2013    Past Surgical History  Procedure Laterality Date  . Thyroidectomy, partial    . Parathyroidectomy      Partial  . Lipoma excision      From back  . Cesarean section      x2  . Tonsillectomy    . Carpal tunnel release Bilateral     Family History  Problem Relation Age of Onset  . Emphysema Mother   . Hyperlipidemia Father   . Liver disease Father   . Cancer Father     SKIN  . Stroke Sister   . Heart disease Brother 48    stents  . Cancer Brother     SKIN, TESTICULAR  . Heart attack Brother   . Hypertension Neg Hx    Social History:  reports that she has never smoked. She has never used smokeless tobacco. She reports that she drinks alcohol. She reports that she does not use illicit drugs.  Allergies:  Allergies  Allergen Reactions  . Codeine Sulfate Nausea And Vomiting  . Demerol [Meperidine] Nausea And Vomiting    No prescriptions prior to admission    No results found for this or any previous visit (from the past 48 hour(s)).  No results found.   Pertinent items are noted in HPI.  Height 4' 11.5" (1.511 m), weight 55.339 kg (122 lb).  General appearance: alert, cooperative and appears  stated age Head: Normocephalic, without obvious abnormality Neck: no JVD Resp: clear to auscultation bilaterally Cardio: regular rate and rhythm, S1, S2 normal, no murmur, click, rub or gallop GI: soft, non-tender; bowel sounds normal; no masses,  no organomegaly Extremities: catching left ring finger Pulses: 2+ and symmetric Skin: Skin color, texture, turgor normal. No rashes or lesions Neurologic: Grossly normal Incision/Wound: na  Assessment/Plan Dx; Trigger left ring finger PLAN: Release A-1 pulley left ring finger.Pre, peri and postoperative course were discussed along with the risks and complications.  The patient is aware there is no guarantee with the surgery, possibility of infection, recurrence, injury to arteries, nerves, tendons, incomplete relief of symptoms and dystrophy.     Kristine Cooper R 03/20/2016, 5:16 AM

## 2016-03-20 NOTE — Anesthesia Preprocedure Evaluation (Signed)
Anesthesia Evaluation  Patient identified by MRN, date of birth, ID band Patient awake    Reviewed: Allergy & Precautions, NPO status , Patient's Chart, lab work & pertinent test results  History of Anesthesia Complications Negative for: history of anesthetic complications  Airway Mallampati: II  TM Distance: >3 FB Neck ROM: Full    Dental  (+) Teeth Intact   Pulmonary neg pulmonary ROS,    breath sounds clear to auscultation       Cardiovascular hypertension, Pt. on medications (-) angina(-) Past MI and (-) CHF  Rhythm:Regular     Neuro/Psych negative neurological ROS  negative psych ROS   GI/Hepatic negative GI ROS, Neg liver ROS,   Endo/Other  Hypothyroidism   Renal/GU negative Renal ROS     Musculoskeletal   Abdominal   Peds  Hematology negative hematology ROS (+)   Anesthesia Other Findings   Reproductive/Obstetrics                             Anesthesia Physical Anesthesia Plan  ASA: II  Anesthesia Plan: Bier Block and MAC   Post-op Pain Management:    Induction: Intravenous  Airway Management Planned: Natural Airway, Nasal Cannula and Simple Face Mask  Additional Equipment: None  Intra-op Plan:   Post-operative Plan:   Informed Consent: I have reviewed the patients History and Physical, chart, labs and discussed the procedure including the risks, benefits and alternatives for the proposed anesthesia with the patient or authorized representative who has indicated his/her understanding and acceptance.   Dental advisory given  Plan Discussed with: CRNA and Surgeon  Anesthesia Plan Comments:         Anesthesia Quick Evaluation

## 2016-03-20 NOTE — Discharge Instructions (Addendum)

## 2016-03-20 NOTE — Transfer of Care (Signed)
Immediate Anesthesia Transfer of Care Note  Patient: Kristine Cooper  Procedure(s) Performed: Procedure(s) with comments: RELEASE A-1 PULLEY LEFT RING FINGER (Left) - ANESTHESIA: IV REGIONAL FAB  Patient Location: PACU  Anesthesia Type:MAC and Bier block  Level of Consciousness: awake and patient cooperative  Airway & Oxygen Therapy: Patient Spontanous Breathing and Patient connected to face mask oxygen  Post-op Assessment: Report given to RN and Post -op Vital signs reviewed and stable  Post vital signs: Reviewed and stable  Last Vitals:  Filed Vitals:   03/20/16 0727  BP: 137/72  Pulse: 65  Temp: 36.7 C  Resp: 20    Last Pain: There were no vitals filed for this visit.       Complications: No apparent anesthesia complications

## 2016-03-21 ENCOUNTER — Encounter (HOSPITAL_BASED_OUTPATIENT_CLINIC_OR_DEPARTMENT_OTHER): Payer: Self-pay | Admitting: Orthopedic Surgery

## 2016-03-22 DIAGNOSIS — Z01419 Encounter for gynecological examination (general) (routine) without abnormal findings: Secondary | ICD-10-CM | POA: Diagnosis not present

## 2016-03-22 DIAGNOSIS — Z6824 Body mass index (BMI) 24.0-24.9, adult: Secondary | ICD-10-CM | POA: Diagnosis not present

## 2016-03-22 DIAGNOSIS — R9439 Abnormal result of other cardiovascular function study: Secondary | ICD-10-CM | POA: Insufficient documentation

## 2016-03-23 ENCOUNTER — Encounter: Payer: Self-pay | Admitting: Interventional Cardiology

## 2016-03-23 ENCOUNTER — Ambulatory Visit (INDEPENDENT_AMBULATORY_CARE_PROVIDER_SITE_OTHER): Payer: Medicare Other | Admitting: Interventional Cardiology

## 2016-03-23 VITALS — BP 146/90 | HR 64 | Ht 59.0 in | Wt 122.1 lb

## 2016-03-23 DIAGNOSIS — E785 Hyperlipidemia, unspecified: Secondary | ICD-10-CM | POA: Diagnosis not present

## 2016-03-23 DIAGNOSIS — I447 Left bundle-branch block, unspecified: Secondary | ICD-10-CM

## 2016-03-23 DIAGNOSIS — I2510 Atherosclerotic heart disease of native coronary artery without angina pectoris: Secondary | ICD-10-CM | POA: Diagnosis not present

## 2016-03-23 DIAGNOSIS — R0789 Other chest pain: Secondary | ICD-10-CM

## 2016-03-23 DIAGNOSIS — R931 Abnormal findings on diagnostic imaging of heart and coronary circulation: Secondary | ICD-10-CM | POA: Diagnosis not present

## 2016-03-23 DIAGNOSIS — R9439 Abnormal result of other cardiovascular function study: Secondary | ICD-10-CM

## 2016-03-23 DIAGNOSIS — I2584 Coronary atherosclerosis due to calcified coronary lesion: Secondary | ICD-10-CM

## 2016-03-23 NOTE — Patient Instructions (Signed)
Medication Instructions:  Your physician recommends that you continue on your current medications as directed. Please refer to the Current Medication list given to you today.   Labwork: -None  Testing/Procedures: -None  Follow-Up: As needed  Any Other Special Instructions Will Be Listed Below (If Applicable).     If you need a refill on your cardiac medications before your next appointment, please call your pharmacy.   

## 2016-03-23 NOTE — Progress Notes (Signed)
Cardiology Office Note    Date:  03/23/2016   ID:  Tessalyn, Dest 1945/11/09, MRN [id number]  PCP:  Shirline Frees, MD  Cardiologist: Sinclair Grooms, MD   Chief Complaint  Patient presents with  . Chest Pain    History of Present Illness:  [name] is a 70 y.o. female follow-up chest discomfort.  Mrs. Heist is 70 and has a history of recurring chest pain. Initial evaluation occurred 3 years ago. An exercise nuclear perfusion study demonstrated abnormality. She ultimately underwent coronary angiography and was found to have minimal nonobstructive disease. She did well for a while but again last fall in early this she is she has experienced midsternal chest discomfort. She relates discomfort to position. Occasionally she awakens and if she is sleeping on her right side there is central sternal discomfort that improves with changing positions or getting out of bed. This discomfort is not precipitated by physical activity. Denies exertional limitations. A relatively recent myocardial perfusion study with exercise treadmill stress demonstrated an anterior wall and abnormality compatible with ischemia. Of note is that the patient developed exercise-induced left bundle-branch block with physical activity.    Past Medical History  Diagnosis Date  . Hypothyroidism     Multinodular goiter  . Abnormal stress test 07/16/2013    a. in 2014 had abnormal nuc which lead to a LHC 07/2013: moderate proximal LAD and RCA calcification without obstruction, widely patent cors, with systolic compression/bridging in the mid to distal LAD segment, normal EF.  [name] Upper airway cough syndrome 12/10/2013  . Hyperlipidemia   . LBBB (left bundle branch block)     a. Rate related LBBB noted on nuc 09/2015.  [name] Hypertension   . Exercise-induced asthma 10/16/2013    Past Surgical History  Procedure Laterality Date  . Thyroidectomy, partial    . Parathyroidectomy      Partial  . Lipoma excision      From back  . Cesarean section      x2  . Tonsillectomy    . Carpal tunnel release Bilateral   . Trigger finger release Left 03/20/2016    Procedure: RELEASE A-1 PULLEY LEFT RING FINGER;  Surgeon: Daryll Brod, MD;  Location: [facility];  Service: Orthopedics;  Laterality: Left;  ANESTHESIA: IV REGIONAL FAB    Current Medications: Outpatient Prescriptions Prior to Visit  Medication Sig Dispense Refill  . amLODipine (NORVASC) 2.5 MG tablet Take 1 tablet (2.5 mg total) by mouth daily. 90 tablet 3  . calcitRIOL (ROCALTROL) 0.5 MCG capsule Take 0.5 mcg by mouth daily.    [name] levothyroxine (SYNTHROID, LEVOTHROID) 50 MCG tablet Take 50 mcg by mouth daily before breakfast.    . Magnesium 100 MG CAPS Take 400 mg by mouth daily.    . Multiple Vitamin (MULTIVITAMIN) tablet Take 1 tablet by mouth daily.    [name] rOPINIRole (REQUIP) 0.5 MG tablet Take 0.25 mg by mouth at bedtime.    . vitamin E 100 UNIT capsule Take 400 Units by mouth daily.    . traMADol (ULTRAM) 50 MG tablet Take 1 tablet (50 mg total) by mouth every 6 (six) hours as needed. 10 tablet 0   No facility-administered medications prior to visit.     Allergies:   Codeine sulfate and Demerol   Social History   Social History  . Marital Status: Married    Spouse Name: N/A  . Number of Children: N/A  . Years of Education: N/A  Social History Main Topics  . Smoking status: Never Smoker   . Smokeless tobacco: Never Used  . Alcohol Use: 0.0 oz/week    0 Standard drinks or equivalent per week     Comment: social  . Drug Use: No  . Sexual Activity: Yes   Other Topics Concern  . Not on file   Social History Narrative     Family History:  The patient's family history includes Cancer in her brother and father; Emphysema in her mother; Heart attack in her brother; Heart disease (age of onset: 19) in her brother; Hyperlipidemia in her father; Liver disease in her father; Stroke in her sister. There is no history of  Hypertension.   ROS:   Please see the history of present illness.    Left hand trigger finger release and current discomfort.  All other systems reviewed and are negative.   PHYSICAL EXAM:   VS:  BP 146/90 mmHg  Pulse 64  Ht 4\' 11"  (1.499 m)  Wt 122 lb 1.9 oz (55.393 kg)  BMI 24.65 kg/m2   GEN: Well nourished, well developed, in no acute distress HEENT: normal Neck: no JVD, carotid bruits, or masses Cardiac: RRR; no murmurs, rubs, or gallops,no edema  Respiratory:  clear to auscultation bilaterally, normal work of breathing GI: soft, nontender, nondistended, + BS MS: no deformity or atrophy Skin: warm and dry, no rash Neuro:  Alert and Oriented x 3, Strength and sensation are intact Psych: euthymic mood, full affect  Wt Readings from Last 3 Encounters:  03/23/16 122 lb 1.9 oz (55.393 kg)  03/20/16 121 lb (54.885 kg)  09/20/15 121 lb (54.885 kg)      Studies/Labs Reviewed:   EKG:  EKG  Is performed and demonstrates normal sinus rhythm, QS pattern in V1 and V2, and otherwise normal appearance.  Recent Labs: No results found for requested labs within last 365 days.   Lipid Panel No results found for: CHOL, TRIG, HDL, CHOLHDL, VLDL, LDLCALC, LDLDIRECT  Additional studies/ records that were reviewed today include:  Prior stress myocardial perfusion study in 2014 and 2016 were reviewed. The most recent study demonstrated a large area of anterior reversibility but occurred in the setting of exercise induced left bundle branch block. Conservative management was undertaken.. She has had no significant change or recurrence in symptoms since that time.  Coronary angiography performed September 2014 demonstrated the following:  IMPRESSIONS: 1. Proximal LAD and RCA calcification  2. No obstructive coronary lesions are noted in the epicardial coronaries. All territories including branches are widely patent. There is systolic compression/bridging in the mid to distal LAD  segment.  3. Normal LV function  4. False positive stress electrocardiographic changes. The mild perfusion defect in the mid anterior wall on nuclear imaging was likely due to breast attenuation   ASSESSMENT:    1. Chest discomfort   2. Abnormal nuclear stress test   3. LBBB (left bundle branch block)   4. Hyperlipidemia   5. Coronary artery calcification      PLAN:  In order of problems listed above:  1. Chest discomfort is felt to represent musculoskeletal discomfort. Prior cardiac catheterization did not reveal obstructive disease. 2 prior stress myocardial perfusion studies demonstrated anterior wall redistribution but in the setting of rate related left bundle branch block, which is felt to be the genesis of the false positive studies. 2. Two prior abnormal myocardial perfusion studies with anterior perfusion abnormality related to bundle branch block. The studies were false positive. These  studies were discussed in detail with the patient including the concept of false positive noninvasive testing. 3. Rate related left bundle branch block is now well documented 4. Lipids are elevated and should be managed as the patient does have coronary calcification but no evidence of obstruction. 5.   Coronary calcification denotes coronary artery disease. Risk factor modification including control of blood pressure and lipids is essential to prevent development of obstructive disease with time.   Medication Adjustments/Labs and Tests Ordered: Current medicines are reviewed at length with the patient today.  Concerns regarding medicines are outlined above.  Medication changes, Labs and Tests ordered today are listed in the Patient Instructions below. Patient Instructions  Medication Instructions:  Your physician recommends that you continue on your current medications as directed. Please refer to the Current Medication list given to you  today.   Labwork: -None  Testing/Procedures: -None  Follow-Up: As needed  Any Other Special Instructions Will Be Listed Below (If Applicable).     If you need a refill on your cardiac medications before your next appointment, please call your pharmacy.       Signed, Sinclair Grooms, MD  03/23/2016 8:25 AM    [facility] [place], [place], [place]  [address] Phone: [phone]; Fax: [phone]

## 2016-04-17 DIAGNOSIS — L03221 Cellulitis of neck: Secondary | ICD-10-CM | POA: Diagnosis not present

## 2016-04-25 DIAGNOSIS — L309 Dermatitis, unspecified: Secondary | ICD-10-CM | POA: Diagnosis not present

## 2016-04-25 DIAGNOSIS — L282 Other prurigo: Secondary | ICD-10-CM | POA: Diagnosis not present

## 2016-05-04 DIAGNOSIS — M65341 Trigger finger, right ring finger: Secondary | ICD-10-CM | POA: Diagnosis not present

## 2016-05-04 DIAGNOSIS — Z4889 Encounter for other specified surgical aftercare: Secondary | ICD-10-CM | POA: Diagnosis not present

## 2016-05-28 DIAGNOSIS — H43813 Vitreous degeneration, bilateral: Secondary | ICD-10-CM | POA: Diagnosis not present

## 2016-05-28 DIAGNOSIS — H10413 Chronic giant papillary conjunctivitis, bilateral: Secondary | ICD-10-CM | POA: Diagnosis not present

## 2016-05-28 DIAGNOSIS — H04123 Dry eye syndrome of bilateral lacrimal glands: Secondary | ICD-10-CM | POA: Diagnosis not present

## 2016-05-28 DIAGNOSIS — H2513 Age-related nuclear cataract, bilateral: Secondary | ICD-10-CM | POA: Diagnosis not present

## 2016-06-29 DIAGNOSIS — L245 Irritant contact dermatitis due to other chemical products: Secondary | ICD-10-CM | POA: Diagnosis not present

## 2016-06-29 DIAGNOSIS — L304 Erythema intertrigo: Secondary | ICD-10-CM | POA: Diagnosis not present

## 2016-08-01 DIAGNOSIS — Z23 Encounter for immunization: Secondary | ICD-10-CM | POA: Diagnosis not present

## 2016-08-05 ENCOUNTER — Other Ambulatory Visit: Payer: Self-pay | Admitting: Interventional Cardiology

## 2016-08-17 DIAGNOSIS — L821 Other seborrheic keratosis: Secondary | ICD-10-CM | POA: Diagnosis not present

## 2016-08-17 DIAGNOSIS — D225 Melanocytic nevi of trunk: Secondary | ICD-10-CM | POA: Diagnosis not present

## 2016-08-17 DIAGNOSIS — L249 Irritant contact dermatitis, unspecified cause: Secondary | ICD-10-CM | POA: Diagnosis not present

## 2016-08-17 DIAGNOSIS — B351 Tinea unguium: Secondary | ICD-10-CM | POA: Diagnosis not present

## 2016-08-17 DIAGNOSIS — L570 Actinic keratosis: Secondary | ICD-10-CM | POA: Diagnosis not present

## 2016-08-31 DIAGNOSIS — I10 Essential (primary) hypertension: Secondary | ICD-10-CM | POA: Diagnosis not present

## 2016-08-31 DIAGNOSIS — I2510 Atherosclerotic heart disease of native coronary artery without angina pectoris: Secondary | ICD-10-CM | POA: Diagnosis not present

## 2016-08-31 DIAGNOSIS — J449 Chronic obstructive pulmonary disease, unspecified: Secondary | ICD-10-CM | POA: Diagnosis not present

## 2016-08-31 DIAGNOSIS — Z0000 Encounter for general adult medical examination without abnormal findings: Secondary | ICD-10-CM | POA: Diagnosis not present

## 2016-08-31 DIAGNOSIS — E039 Hypothyroidism, unspecified: Secondary | ICD-10-CM | POA: Diagnosis not present

## 2016-08-31 DIAGNOSIS — E7800 Pure hypercholesterolemia, unspecified: Secondary | ICD-10-CM | POA: Diagnosis not present

## 2016-08-31 DIAGNOSIS — M810 Age-related osteoporosis without current pathological fracture: Secondary | ICD-10-CM | POA: Diagnosis not present

## 2016-08-31 DIAGNOSIS — G2581 Restless legs syndrome: Secondary | ICD-10-CM | POA: Diagnosis not present

## 2016-10-02 DIAGNOSIS — L300 Nummular dermatitis: Secondary | ICD-10-CM | POA: Diagnosis not present

## 2016-10-02 DIAGNOSIS — L282 Other prurigo: Secondary | ICD-10-CM | POA: Diagnosis not present

## 2016-10-09 DIAGNOSIS — Z800 Family history of malignant neoplasm of digestive organs: Secondary | ICD-10-CM | POA: Diagnosis not present

## 2016-10-09 DIAGNOSIS — D127 Benign neoplasm of rectosigmoid junction: Secondary | ICD-10-CM | POA: Diagnosis not present

## 2016-10-09 DIAGNOSIS — Z1211 Encounter for screening for malignant neoplasm of colon: Secondary | ICD-10-CM | POA: Diagnosis not present

## 2016-10-09 DIAGNOSIS — K5730 Diverticulosis of large intestine without perforation or abscess without bleeding: Secondary | ICD-10-CM | POA: Diagnosis not present

## 2016-10-09 DIAGNOSIS — Q438 Other specified congenital malformations of intestine: Secondary | ICD-10-CM | POA: Diagnosis not present

## 2016-10-09 DIAGNOSIS — D126 Benign neoplasm of colon, unspecified: Secondary | ICD-10-CM | POA: Diagnosis not present

## 2016-10-09 NOTE — Progress Notes (Signed)
Cardiology Office Note:    Date:  10/10/2016   ID:  ADAM SCHEPPS, DOB [birth year], MRN [id number]  PCP:  Shirline Frees, MD  Cardiologist:  Dr. Daneen Schick   Electrophysiologist:  N/a GI: Dr. Teena Irani  Referring MD: Shirline Frees, MD   Chief Complaint  Patient presents with  . PVCs    History of Present Illness:    Kristine Cooper is a 70 y.o. female with a hx of hypothyroidism, HL, exercise induced asthma, upper airway cough syndrome, rate related LBBB.  She had an abnormal nuclear stress test that led to a LHC in 9/14 with mod proximal LAD and RCA calcification without obstruction (widely patent coronary arteries).  There was systolic bridging/compression in the mid to distal LAD.  Repeat nuclear stress test was done in 11/16 for chest pain.  This was high risk with large size, moderate intensity reversible (SDS 6) anterior, anteroapical, septal and inferoseptal perfusion defect suggestive of ischemia. The findings could be related to rate-dependent LBBB which developed during the stress portion of the study and terminated in recovery. LVEF was 68% with incoordinate septal motion.  This was reviewed by Dr. Daneen Schick.  Given her hx of false + stress perfusion study, he recommended LHC only for progressive symptoms.  Patient declined cath at that time.  She was last seen by Dr. Daneen Schick in 5/17.  Her chest pain was felt to be more MSK at that time.  PRN follow up was recommended.    She underwent a colonoscopy yesterday. She is referred back today for evaluation of PVCs during her procedure. The patient notes that her colonoscopy was uncomplicated. She did have some polyps. She has not felt any palpitations or rapid heartbeats. She denies chest discomfort. She has chronic dyspnea with exertion. This is fairly mild and unchanged. She denies orthopnea, PND or edema. She denies syncope or near-syncope.  Prior CV studies that were reviewed today include:    Myoview 11/16 Large size,  moderate intensity reversible (SDS 6) anterior, anteroapical, septal and inferoseptal perfusion defect suggestive of ischemia. Findings could also be related to rate-dependent LBBB which developed during the stress portion of the study and terminated in recovery. LVEF is 68% with incoordinate septal motion. This is a high risk study (given location and extent of the perfusion defect).  LHC 9/14 LM normal LAD moderate proximal calcification with no significant luminal narrowing, mid to distal systolic compression/bridging LCx normal RCA moderate proximal to mid calcification with no obstruction EF 65  Past Medical History:  Diagnosis Date  . Abnormal stress test 07/16/2013   a. in 2014 had abnormal nuc which lead to a LHC 07/2013: moderate proximal LAD and RCA calcification without obstruction, widely patent cors, with systolic compression/bridging in the mid to distal LAD segment, normal EF.  [name] Exercise-induced asthma 10/16/2013  . Hyperlipidemia   . Hypertension   . Hypothyroidism    Multinodular goiter  . LBBB (left bundle branch block)    a. Rate related LBBB noted on nuc 09/2015.  [name] Upper airway cough syndrome 12/10/2013    Past Surgical History:  Procedure Laterality Date  . CARPAL TUNNEL RELEASE Bilateral   . CESAREAN SECTION     x2  . LIPOMA EXCISION     From back  . PARATHYROIDECTOMY     Partial  . THYROIDECTOMY, PARTIAL    . TONSILLECTOMY    . TRIGGER FINGER RELEASE Left 03/20/2016   Procedure: RELEASE A-1 PULLEY LEFT RING FINGER;  Surgeon:  Daryll Brod, MD;  Location: [facility];  Service: Orthopedics;  Laterality: Left;  ANESTHESIA: IV REGIONAL FAB    Current Medications: Current Meds  Medication Sig  . amLODipine (NORVASC) 2.5 MG tablet TAKE 1 TABLET BY MOUTH  DAILY  . betamethasone dipropionate (DIPROLENE) 0.05 % cream Apply 0.05 % topically 2 (two) times daily.   . calcitRIOL (ROCALTROL) 0.5 MCG capsule Take 0.5 mcg by mouth daily.  [name] levothyroxine  (SYNTHROID, LEVOTHROID) 50 MCG tablet Take 50 mcg by mouth daily before breakfast.  . Magnesium 100 MG CAPS Take 400 mg by mouth daily.  . Multiple Vitamin (MULTIVITAMIN) tablet Take 1 tablet by mouth daily.  . pramipexole (MIRAPEX) 0.25 MG tablet Take 0.25 mg by mouth at bedtime and may repeat dose one time if needed.   . vitamin E 100 UNIT capsule Take 400 Units by mouth daily.     Allergies:   Codeine sulfate and Demerol [meperidine]   Social History   Social History  . Marital status: Married    Spouse name: N/A  . Number of children: N/A  . Years of education: N/A   Social History Main Topics  . Smoking status: Never Smoker  . Smokeless tobacco: Never Used  . Alcohol use 0.0 oz/week     Comment: social  . Drug use: No  . Sexual activity: Yes   Other Topics Concern  . Not on file   Social History Narrative  . No narrative on file     Family History:  The patient's family history includes Cancer in her brother and father; Emphysema in her mother; Heart attack in her brother; Heart disease (age of onset: 32) in her brother; Hyperlipidemia in her father; Liver disease in her father; Stroke in her sister.   ROS:   Please see the history of present illness.    Review of Systems  Constitution: Positive for diaphoresis.  Cardiovascular: Positive for dyspnea on exertion.  Skin: Positive for rash.  Musculoskeletal: Positive for joint pain.   All other systems reviewed and are negative.   EKGs/Labs/Other Test Reviewed:    EKG:  EKG is  ordered today.  The ekg ordered today demonstrates NSR, HR 71, normal axis, nonspecific ST-T wave changes, QTc 408 ms, no change from prior tracing  Recent Labs: No results found for requested labs within last 8760 hours.   Recent Lipid Panel No results found for: CHOL, TRIG, HDL, CHOLHDL, VLDL, LDLCALC, LDLDIRECT   Physical Exam:    VS:  BP 138/88   Pulse 71   Ht 4' 11.5" (1.511 m)   Wt 122 lb 1.9 oz (55.4 kg)   BMI 24.25 kg/m      Wt Readings from Last 3 Encounters:  10/10/16 122 lb 1.9 oz (55.4 kg)  03/23/16 122 lb 1.9 oz (55.4 kg)  03/20/16 121 lb (54.9 kg)     Physical Exam  Constitutional: She is oriented to person, place, and time. She appears well-developed and well-nourished. No distress.  HENT:  Head: Normocephalic and atraumatic.  Eyes: No scleral icterus.  Neck: No JVD present.  Cardiovascular: Normal rate, regular rhythm and normal heart sounds.   No murmur heard. Pulmonary/Chest: Effort normal. She has no wheezes. She has no rales.  Abdominal: Soft. There is no tenderness.  Musculoskeletal: She exhibits no edema.  Neurological: She is alert and oriented to person, place, and time.  Skin: Skin is warm and dry.  Psychiatric: She has a normal mood and affect.  ASSESSMENT:    1. LBBB (left bundle branch block)   2. Coronary artery calcification   3. Essential hypertension    PLAN:    In order of problems listed above:  1. LBBB - She has a rate related left bundle branch block. I reviewed her stress test from last year with her. She develops a left bundle branch block with higher heart rates. I reviewed her telemetry strips from her colonoscopy yesterday. It appears that this demonstrates her left bundle branch block as her heart rate is elevated. There may be a few PVCs noted. She denies palpitations or rapid palpitations or syncope. I will obtain a BMET, magnesium today.  To confirm her rhythm, I will arrange 48 hour Holter monitor.  2. Coronary artery calcification - The patient has a history of false positive stress test. Cardiac catheterization in 2014 demonstrated calcification but no obstructive disease. Continue risk factor modification with primary care.   3. HTN -  Blood pressure is controlled.    Medication Adjustments/Labs and Tests Ordered: Current medicines are reviewed at length with the patient today.  Concerns regarding medicines are outlined above.  Medication changes,  Labs and Tests ordered today are outlined in the Patient Instructions noted below. Patient Instructions  Medication Instructions:  No changes.  Labwork: Today - BMET, Magnesium  Testing/Procedures: Schedule 48 hour Holter next week.  Follow-Up: Dr. Daneen Schick as needed.  Any Other Special Instructions Will Be Listed Below (If Applicable). You have a "rate related left bundle branch block (LBBB)." We saw this on your ECGs last year during your stress test. If your heart rate gets to a certain level, it changes the way your electricity is conducted in your heart which changes the appearance of your ECG. This seems to be normal for you and is not causing any issues. I recommend you let all your physicians know this, especially before any procedure.  If you need a refill on your cardiac medications before your next appointment, please call your pharmacy.   Signed, Richardson Dopp, PA-C  10/10/2016 9:35 AM    [facility] [place], [place], [place]  [address] Phone: [phone]; Fax: [phone]

## 2016-10-10 ENCOUNTER — Encounter: Payer: Self-pay | Admitting: Physician Assistant

## 2016-10-10 ENCOUNTER — Ambulatory Visit (INDEPENDENT_AMBULATORY_CARE_PROVIDER_SITE_OTHER): Payer: Medicare Other | Admitting: Physician Assistant

## 2016-10-10 VITALS — BP 138/88 | HR 71 | Ht 59.5 in | Wt 122.1 lb

## 2016-10-10 DIAGNOSIS — I447 Left bundle-branch block, unspecified: Secondary | ICD-10-CM

## 2016-10-10 DIAGNOSIS — I2584 Coronary atherosclerosis due to calcified coronary lesion: Secondary | ICD-10-CM | POA: Diagnosis not present

## 2016-10-10 DIAGNOSIS — I10 Essential (primary) hypertension: Secondary | ICD-10-CM | POA: Diagnosis not present

## 2016-10-10 DIAGNOSIS — I2510 Atherosclerotic heart disease of native coronary artery without angina pectoris: Secondary | ICD-10-CM

## 2016-10-10 LAB — MAGNESIUM: MAGNESIUM: 2.1 mg/dL (ref 1.5–2.5)

## 2016-10-10 NOTE — Patient Instructions (Signed)
Medication Instructions:  No changes.  Labwork: Today - BMET, Magnesium  Testing/Procedures: Schedule 48 hour Holter next week.  Follow-Up: Dr. Daneen Schick as needed.  Any Other Special Instructions Will Be Listed Below (If Applicable). You have a "rate related left bundle branch block (LBBB)." We saw this on your ECGs last year during your stress test. If your heart rate gets to a certain level, it changes the way your electricity is conducted in your heart which changes the appearance of your ECG. This seems to be normal for you and is not causing any issues. I recommend you let all your physicians know this, especially before any procedure.  If you need a refill on your cardiac medications before your next appointment, please call your pharmacy.

## 2016-10-11 LAB — BASIC METABOLIC PANEL WITH GFR
BUN: 9 mg/dL (ref 7–25)
CO2: 26 mmol/L (ref 20–31)
Calcium: 8.9 mg/dL (ref 8.6–10.4)
Chloride: 105 mmol/L (ref 98–110)
Creat: 0.71 mg/dL (ref 0.50–0.99)
Glucose, Bld: 99 mg/dL (ref 65–99)
Potassium: 4 mmol/L (ref 3.5–5.3)
Sodium: 142 mmol/L (ref 135–146)

## 2016-10-12 DIAGNOSIS — D126 Benign neoplasm of colon, unspecified: Secondary | ICD-10-CM | POA: Diagnosis not present

## 2016-10-12 DIAGNOSIS — Z1211 Encounter for screening for malignant neoplasm of colon: Secondary | ICD-10-CM | POA: Diagnosis not present

## 2016-10-17 ENCOUNTER — Other Ambulatory Visit: Payer: Self-pay | Admitting: Physician Assistant

## 2016-10-17 DIAGNOSIS — I447 Left bundle-branch block, unspecified: Secondary | ICD-10-CM

## 2016-10-17 DIAGNOSIS — I493 Ventricular premature depolarization: Secondary | ICD-10-CM

## 2016-10-18 ENCOUNTER — Ambulatory Visit (INDEPENDENT_AMBULATORY_CARE_PROVIDER_SITE_OTHER): Payer: Medicare Other

## 2016-10-18 DIAGNOSIS — I493 Ventricular premature depolarization: Secondary | ICD-10-CM | POA: Diagnosis not present

## 2016-10-18 DIAGNOSIS — I447 Left bundle-branch block, unspecified: Secondary | ICD-10-CM

## 2016-10-23 DIAGNOSIS — J209 Acute bronchitis, unspecified: Secondary | ICD-10-CM | POA: Diagnosis not present

## 2016-11-12 DIAGNOSIS — L245 Irritant contact dermatitis due to other chemical products: Secondary | ICD-10-CM | POA: Diagnosis not present

## 2016-11-12 DIAGNOSIS — L408 Other psoriasis: Secondary | ICD-10-CM | POA: Diagnosis not present

## 2016-11-28 DIAGNOSIS — M65341 Trigger finger, right ring finger: Secondary | ICD-10-CM | POA: Diagnosis not present

## 2016-12-12 DIAGNOSIS — J0100 Acute maxillary sinusitis, unspecified: Secondary | ICD-10-CM | POA: Diagnosis not present

## 2016-12-31 DIAGNOSIS — M65341 Trigger finger, right ring finger: Secondary | ICD-10-CM | POA: Diagnosis not present

## 2017-01-15 ENCOUNTER — Other Ambulatory Visit: Payer: Self-pay | Admitting: Obstetrics and Gynecology

## 2017-01-15 DIAGNOSIS — Z1231 Encounter for screening mammogram for malignant neoplasm of breast: Secondary | ICD-10-CM

## 2017-02-08 ENCOUNTER — Other Ambulatory Visit: Payer: Self-pay | Admitting: Family Medicine

## 2017-02-08 DIAGNOSIS — D492 Neoplasm of unspecified behavior of bone, soft tissue, and skin: Secondary | ICD-10-CM | POA: Diagnosis not present

## 2017-02-08 DIAGNOSIS — R21 Rash and other nonspecific skin eruption: Secondary | ICD-10-CM | POA: Diagnosis not present

## 2017-02-08 DIAGNOSIS — L309 Dermatitis, unspecified: Secondary | ICD-10-CM | POA: Diagnosis not present

## 2017-02-08 DIAGNOSIS — L989 Disorder of the skin and subcutaneous tissue, unspecified: Secondary | ICD-10-CM | POA: Diagnosis not present

## 2017-02-11 DIAGNOSIS — L298 Other pruritus: Secondary | ICD-10-CM | POA: Diagnosis not present

## 2017-02-11 DIAGNOSIS — L309 Dermatitis, unspecified: Secondary | ICD-10-CM | POA: Diagnosis not present

## 2017-02-11 DIAGNOSIS — L905 Scar conditions and fibrosis of skin: Secondary | ICD-10-CM | POA: Diagnosis not present

## 2017-02-20 ENCOUNTER — Ambulatory Visit
Admission: RE | Admit: 2017-02-20 | Discharge: 2017-02-20 | Disposition: A | Discharge status: Home / Self Care | Payer: Medicare Other | Source: Ambulatory Visit | Attending: Obstetrics and Gynecology | Admitting: Obstetrics and Gynecology

## 2017-02-20 DIAGNOSIS — Z1231 Encounter for screening mammogram for malignant neoplasm of breast: Secondary | ICD-10-CM | POA: Diagnosis not present

## 2017-03-12 DIAGNOSIS — L309 Dermatitis, unspecified: Secondary | ICD-10-CM | POA: Diagnosis not present

## 2017-03-12 DIAGNOSIS — L282 Other prurigo: Secondary | ICD-10-CM | POA: Diagnosis not present

## 2017-04-11 DIAGNOSIS — Z780 Asymptomatic menopausal state: Secondary | ICD-10-CM | POA: Diagnosis not present

## 2017-04-11 DIAGNOSIS — M816 Localized osteoporosis [Lequesne]: Secondary | ICD-10-CM | POA: Diagnosis not present

## 2017-04-11 DIAGNOSIS — Z124 Encounter for screening for malignant neoplasm of cervix: Secondary | ICD-10-CM | POA: Diagnosis not present

## 2017-04-11 DIAGNOSIS — N958 Other specified menopausal and perimenopausal disorders: Secondary | ICD-10-CM | POA: Diagnosis not present

## 2017-04-11 DIAGNOSIS — N952 Postmenopausal atrophic vaginitis: Secondary | ICD-10-CM | POA: Diagnosis not present

## 2017-05-31 DIAGNOSIS — H43813 Vitreous degeneration, bilateral: Secondary | ICD-10-CM | POA: Diagnosis not present

## 2017-05-31 DIAGNOSIS — H2513 Age-related nuclear cataract, bilateral: Secondary | ICD-10-CM | POA: Diagnosis not present

## 2017-05-31 DIAGNOSIS — H04123 Dry eye syndrome of bilateral lacrimal glands: Secondary | ICD-10-CM | POA: Diagnosis not present

## 2017-06-07 DIAGNOSIS — M65341 Trigger finger, right ring finger: Secondary | ICD-10-CM | POA: Diagnosis not present

## 2017-07-25 DIAGNOSIS — Z23 Encounter for immunization: Secondary | ICD-10-CM | POA: Diagnosis not present

## 2017-09-02 ENCOUNTER — Ambulatory Visit
Admission: RE | Admit: 2017-09-02 | Discharge: 2017-09-02 | Disposition: A | Discharge status: Home / Self Care | Payer: Medicare Other | Source: Ambulatory Visit | Attending: Family Medicine | Admitting: Family Medicine

## 2017-09-02 ENCOUNTER — Other Ambulatory Visit: Payer: Self-pay | Admitting: Family Medicine

## 2017-09-02 DIAGNOSIS — M810 Age-related osteoporosis without current pathological fracture: Secondary | ICD-10-CM | POA: Diagnosis not present

## 2017-09-02 DIAGNOSIS — R0600 Dyspnea, unspecified: Secondary | ICD-10-CM

## 2017-09-02 DIAGNOSIS — R0609 Other forms of dyspnea: Principal | ICD-10-CM

## 2017-09-02 DIAGNOSIS — E200 Idiopathic hypoparathyroidism: Secondary | ICD-10-CM | POA: Diagnosis not present

## 2017-09-02 DIAGNOSIS — E7800 Pure hypercholesterolemia, unspecified: Secondary | ICD-10-CM | POA: Diagnosis not present

## 2017-09-02 DIAGNOSIS — Z0000 Encounter for general adult medical examination without abnormal findings: Secondary | ICD-10-CM | POA: Diagnosis not present

## 2017-09-02 DIAGNOSIS — E039 Hypothyroidism, unspecified: Secondary | ICD-10-CM | POA: Diagnosis not present

## 2017-09-02 DIAGNOSIS — R0602 Shortness of breath: Secondary | ICD-10-CM | POA: Diagnosis not present

## 2017-09-02 DIAGNOSIS — I10 Essential (primary) hypertension: Secondary | ICD-10-CM | POA: Diagnosis not present

## 2017-09-02 DIAGNOSIS — J449 Chronic obstructive pulmonary disease, unspecified: Secondary | ICD-10-CM | POA: Diagnosis not present

## 2017-09-02 DIAGNOSIS — G2581 Restless legs syndrome: Secondary | ICD-10-CM | POA: Diagnosis not present

## 2017-09-02 DIAGNOSIS — I2510 Atherosclerotic heart disease of native coronary artery without angina pectoris: Secondary | ICD-10-CM | POA: Diagnosis not present

## 2017-09-02 DIAGNOSIS — Z23 Encounter for immunization: Secondary | ICD-10-CM | POA: Diagnosis not present

## 2017-09-04 DIAGNOSIS — T881XXA Other complications following immunization, not elsewhere classified, initial encounter: Secondary | ICD-10-CM | POA: Diagnosis not present

## 2017-09-04 DIAGNOSIS — I889 Nonspecific lymphadenitis, unspecified: Secondary | ICD-10-CM | POA: Diagnosis not present

## 2017-09-04 DIAGNOSIS — E200 Idiopathic hypoparathyroidism: Secondary | ICD-10-CM | POA: Diagnosis not present

## 2017-09-04 DIAGNOSIS — M810 Age-related osteoporosis without current pathological fracture: Secondary | ICD-10-CM | POA: Diagnosis not present

## 2017-09-04 DIAGNOSIS — E7800 Pure hypercholesterolemia, unspecified: Secondary | ICD-10-CM | POA: Diagnosis not present

## 2017-09-16 ENCOUNTER — Other Ambulatory Visit: Payer: Self-pay

## 2017-09-16 ENCOUNTER — Encounter (HOSPITAL_BASED_OUTPATIENT_CLINIC_OR_DEPARTMENT_OTHER): Payer: Self-pay | Admitting: *Deleted

## 2017-09-17 ENCOUNTER — Encounter (HOSPITAL_BASED_OUTPATIENT_CLINIC_OR_DEPARTMENT_OTHER)
Admission: RE | Admit: 2017-09-17 | Discharge: 2017-09-17 | Disposition: A | Discharge status: Home / Self Care | Payer: Medicare Other | Source: Ambulatory Visit | Attending: Orthopedic Surgery | Admitting: Orthopedic Surgery

## 2017-09-17 DIAGNOSIS — I447 Left bundle-branch block, unspecified: Secondary | ICD-10-CM | POA: Insufficient documentation

## 2017-09-17 DIAGNOSIS — Z01810 Encounter for preprocedural cardiovascular examination: Secondary | ICD-10-CM | POA: Diagnosis not present

## 2017-09-17 DIAGNOSIS — I10 Essential (primary) hypertension: Secondary | ICD-10-CM | POA: Diagnosis not present

## 2017-09-17 NOTE — Progress Notes (Signed)
EKG reviewed by Dr. Lissa Hoard, will proceed with surgery as scheduled.

## 2017-09-30 ENCOUNTER — Other Ambulatory Visit: Payer: Self-pay | Admitting: Orthopedic Surgery

## 2017-09-30 NOTE — Anesthesia Preprocedure Evaluation (Signed)
Anesthesia Evaluation  Patient identified by MRN, date of birth, ID band Patient awake    Reviewed: Allergy & Precautions, NPO status , Patient's Chart, lab work & pertinent test results  History of Anesthesia Complications Negative for: history of anesthetic complications  Airway Mallampati: II  TM Distance: >3 FB Neck ROM: Full    Dental  (+) Teeth Intact   Pulmonary neg pulmonary ROS, asthma ,    breath sounds clear to auscultation       Cardiovascular hypertension, Pt. on medications and On Medications (-) angina(-) Past MI and (-) CHF  Rhythm:Regular     Neuro/Psych negative neurological ROS  negative psych ROS   GI/Hepatic negative GI ROS, Neg liver ROS,   Endo/Other  Hypothyroidism   Renal/GU negative Renal ROS     Musculoskeletal   Abdominal   Peds  Hematology negative hematology ROS (+)   Anesthesia Other Findings   Reproductive/Obstetrics                             Anesthesia Physical  Anesthesia Plan  ASA: II  Anesthesia Plan: Bier Block and MAC and Bier Block-LIDOCAINE ONLY   Post-op Pain Management:    Induction: Intravenous  PONV Risk Score and Plan: 1 and Ondansetron and Treatment may vary due to age or medical condition  Airway Management Planned: Natural Airway, Nasal Cannula and Simple Face Mask  Additional Equipment: None  Intra-op Plan:   Post-operative Plan:   Informed Consent: I have reviewed the patients History and Physical, chart, labs and discussed the procedure including the risks, benefits and alternatives for the proposed anesthesia with the patient or authorized representative who has indicated his/her understanding and acceptance.   Dental advisory given  Plan Discussed with: CRNA and Surgeon  Anesthesia Plan Comments:         Anesthesia Quick Evaluation

## 2017-10-01 ENCOUNTER — Encounter (HOSPITAL_BASED_OUTPATIENT_CLINIC_OR_DEPARTMENT_OTHER)
Admission: RE | Disposition: A | Discharge status: Home / Self Care | Payer: Self-pay | Source: Ambulatory Visit | Attending: Orthopedic Surgery

## 2017-10-01 ENCOUNTER — Ambulatory Visit (HOSPITAL_BASED_OUTPATIENT_CLINIC_OR_DEPARTMENT_OTHER): Payer: Medicare Other | Admitting: Anesthesiology

## 2017-10-01 ENCOUNTER — Encounter (HOSPITAL_BASED_OUTPATIENT_CLINIC_OR_DEPARTMENT_OTHER): Payer: Self-pay | Admitting: Certified Registered Nurse Anesthetist

## 2017-10-01 ENCOUNTER — Ambulatory Visit (HOSPITAL_BASED_OUTPATIENT_CLINIC_OR_DEPARTMENT_OTHER)
Admission: RE | Admit: 2017-10-01 | Discharge: 2017-10-01 | Disposition: A | Discharge status: Home / Self Care | Payer: Medicare Other | Source: Ambulatory Visit | Attending: Orthopedic Surgery | Admitting: Orthopedic Surgery

## 2017-10-01 DIAGNOSIS — E785 Hyperlipidemia, unspecified: Secondary | ICD-10-CM | POA: Insufficient documentation

## 2017-10-01 DIAGNOSIS — I10 Essential (primary) hypertension: Secondary | ICD-10-CM | POA: Diagnosis not present

## 2017-10-01 DIAGNOSIS — E039 Hypothyroidism, unspecified: Secondary | ICD-10-CM | POA: Insufficient documentation

## 2017-10-01 DIAGNOSIS — M65341 Trigger finger, right ring finger: Secondary | ICD-10-CM | POA: Diagnosis not present

## 2017-10-01 DIAGNOSIS — G2581 Restless legs syndrome: Secondary | ICD-10-CM | POA: Diagnosis not present

## 2017-10-01 DIAGNOSIS — Z79899 Other long term (current) drug therapy: Secondary | ICD-10-CM | POA: Insufficient documentation

## 2017-10-01 DIAGNOSIS — M659 Synovitis and tenosynovitis, unspecified: Secondary | ICD-10-CM | POA: Diagnosis not present

## 2017-10-01 HISTORY — DX: Unspecified osteoarthritis, unspecified site: M19.90

## 2017-10-01 HISTORY — PX: TRIGGER FINGER RELEASE: SHX641

## 2017-10-01 HISTORY — DX: Restless legs syndrome: G25.81

## 2017-10-01 SURGERY — RELEASE, A1 PULLEY, FOR TRIGGER FINGER
Anesthesia: Monitor Anesthesia Care | Site: Finger | Laterality: Right

## 2017-10-01 MED ORDER — FENTANYL CITRATE (PF) 100 MCG/2ML IJ SOLN
INTRAMUSCULAR | Status: DC | PRN
  Administered 2017-10-01: 25 ug via INTRAVENOUS

## 2017-10-01 MED ORDER — MIDAZOLAM HCL 2 MG/2ML IJ SOLN
INTRAMUSCULAR | Status: AC
  Filled 2017-10-01: qty 2

## 2017-10-01 MED ORDER — ONDANSETRON HCL 4 MG/2ML IJ SOLN
INTRAMUSCULAR | Status: AC
  Filled 2017-10-01: qty 2

## 2017-10-01 MED ORDER — PROPOFOL 500 MG/50ML IV EMUL
INTRAVENOUS | Status: DC | PRN
  Administered 2017-10-01: 75 ug/kg/min via INTRAVENOUS

## 2017-10-01 MED ORDER — TRAMADOL HCL 50 MG PO TABS: 50.0000 mg | ORAL_TABLET | Freq: Four times a day (QID) | ORAL | 0 refills | Status: DC | PRN

## 2017-10-01 MED ORDER — PROPOFOL 10 MG/ML IV BOLUS
INTRAVENOUS | Status: AC
  Filled 2017-10-01: qty 40

## 2017-10-01 MED ORDER — ONDANSETRON HCL 4 MG/2ML IJ SOLN
INTRAMUSCULAR | Status: DC | PRN
  Administered 2017-10-01: 4 mg via INTRAVENOUS

## 2017-10-01 MED ORDER — SCOPOLAMINE 1 MG/3DAYS TD PT72: 1.0000 | MEDICATED_PATCH | Freq: Once | TRANSDERMAL | Status: DC | PRN

## 2017-10-01 MED ORDER — CEFAZOLIN SODIUM-DEXTROSE 2-4 GM/100ML-% IV SOLN
2.0000 g | INTRAVENOUS | Status: AC
  Administered 2017-10-01: 2 g via INTRAVENOUS

## 2017-10-01 MED ORDER — FENTANYL CITRATE (PF) 100 MCG/2ML IJ SOLN: 50.0000 ug | INTRAMUSCULAR | Status: DC | PRN

## 2017-10-01 MED ORDER — LACTATED RINGERS IV SOLN
INTRAVENOUS | Status: DC
  Administered 2017-10-01: 08:00:00 via INTRAVENOUS

## 2017-10-01 MED ORDER — LIDOCAINE HCL (PF) 1 % IJ SOLN
INTRAMUSCULAR | Status: AC
  Filled 2017-10-01: qty 5

## 2017-10-01 MED ORDER — CEFAZOLIN SODIUM-DEXTROSE 2-4 GM/100ML-% IV SOLN
INTRAVENOUS | Status: AC
  Filled 2017-10-01: qty 100

## 2017-10-01 MED ORDER — BUPIVACAINE HCL (PF) 0.25 % IJ SOLN
INTRAMUSCULAR | Status: AC
  Filled 2017-10-01: qty 210

## 2017-10-01 MED ORDER — FENTANYL CITRATE (PF) 100 MCG/2ML IJ SOLN
INTRAMUSCULAR | Status: AC
  Filled 2017-10-01: qty 2

## 2017-10-01 MED ORDER — PROPOFOL 10 MG/ML IV BOLUS
INTRAVENOUS | Status: AC
  Filled 2017-10-01: qty 20

## 2017-10-01 MED ORDER — PROPOFOL 500 MG/50ML IV EMUL
INTRAVENOUS | Status: AC
  Filled 2017-10-01: qty 50

## 2017-10-01 MED ORDER — LIDOCAINE HCL (PF) 0.5 % IJ SOLN
INTRAMUSCULAR | Status: DC | PRN
  Administered 2017-10-01: 30 mL via INTRAVENOUS

## 2017-10-01 MED ORDER — MIDAZOLAM HCL 2 MG/2ML IJ SOLN: 1.0000 mg | INTRAMUSCULAR | Status: DC | PRN

## 2017-10-01 MED ORDER — FENTANYL CITRATE (PF) 100 MCG/2ML IJ SOLN: 25.0000 ug | INTRAMUSCULAR | Status: DC | PRN

## 2017-10-01 MED ORDER — BUPIVACAINE HCL (PF) 0.25 % IJ SOLN
INTRAMUSCULAR | Status: DC | PRN
  Administered 2017-10-01: 6 mL

## 2017-10-01 MED ORDER — CHLORHEXIDINE GLUCONATE 4 % EX LIQD: 60.0000 mL | Freq: Once | CUTANEOUS | Status: DC

## 2017-10-01 MED ORDER — MIDAZOLAM HCL 5 MG/5ML IJ SOLN
INTRAMUSCULAR | Status: DC | PRN
  Administered 2017-10-01: 2 mg via INTRAVENOUS

## 2017-10-01 SURGICAL SUPPLY — 32 items
BANDAGE COBAN STERILE 2 (GAUZE/BANDAGES/DRESSINGS) ×2 IMPLANT
BLADE SURG 15 STRL LF DISP TIS (BLADE) ×1 IMPLANT
BLADE SURG 15 STRL SS (BLADE) ×1
BNDG ESMARK 4X9 LF (GAUZE/BANDAGES/DRESSINGS) IMPLANT
CHLORAPREP W/TINT 26ML (MISCELLANEOUS) ×2 IMPLANT
CORD BIPOLAR FORCEPS 12FT (ELECTRODE) IMPLANT
COVER BACK TABLE 60X90IN (DRAPES) ×2 IMPLANT
COVER MAYO STAND STRL (DRAPES) ×2 IMPLANT
CUFF TOURNIQUET SINGLE 18IN (TOURNIQUET CUFF) ×2 IMPLANT
DECANTER SPIKE VIAL GLASS SM (MISCELLANEOUS) IMPLANT
DRAPE EXTREMITY T 121X128X90 (DRAPE) ×2 IMPLANT
DRAPE SURG 17X23 STRL (DRAPES) ×2 IMPLANT
GAUZE SPONGE 4X4 12PLY STRL (GAUZE/BANDAGES/DRESSINGS) ×2 IMPLANT
GAUZE XEROFORM 1X8 LF (GAUZE/BANDAGES/DRESSINGS) ×2 IMPLANT
GLOVE BIOGEL PI IND STRL 7.0 (GLOVE) ×1 IMPLANT
GLOVE BIOGEL PI IND STRL 8.5 (GLOVE) ×1 IMPLANT
GLOVE BIOGEL PI INDICATOR 7.0 (GLOVE) ×1
GLOVE BIOGEL PI INDICATOR 8.5 (GLOVE) ×1
GLOVE ECLIPSE 6.5 STRL STRAW (GLOVE) ×2 IMPLANT
GLOVE SURG ORTHO 8.0 STRL STRW (GLOVE) ×2 IMPLANT
GOWN STRL REUS W/ TWL LRG LVL3 (GOWN DISPOSABLE) ×1 IMPLANT
GOWN STRL REUS W/TWL LRG LVL3 (GOWN DISPOSABLE) ×2
GOWN STRL REUS W/TWL XL LVL3 (GOWN DISPOSABLE) ×2 IMPLANT
NEEDLE PRECISIONGLIDE 27X1.5 (NEEDLE) ×2 IMPLANT
NS IRRIG 1000ML POUR BTL (IV SOLUTION) ×2 IMPLANT
PACK BASIN DAY SURGERY FS (CUSTOM PROCEDURE TRAY) ×2 IMPLANT
STOCKINETTE 4X48 STRL (DRAPES) ×2 IMPLANT
SUT ETHILON 4 0 PS 2 18 (SUTURE) ×2 IMPLANT
SYR BULB 3OZ (MISCELLANEOUS) ×2 IMPLANT
SYR CONTROL 10ML LL (SYRINGE) ×2 IMPLANT
TOWEL OR 17X24 6PK STRL BLUE (TOWEL DISPOSABLE) ×4 IMPLANT
UNDERPAD 30X30 (UNDERPADS AND DIAPERS) ×2 IMPLANT

## 2017-10-01 NOTE — Brief Op Note (Signed)
10/01/2017  8:58 AM  PATIENT:  Kristine Cooper  71 y.o. female  PRE-OPERATIVE DIAGNOSIS:  TRIGGER FINGER, RIGHT RING FINGER  POST-OPERATIVE DIAGNOSIS:  TRIGGER FINGER, RIGHT RING FINGER  PROCEDURE:  Procedure(s): RELEASE TRIGGER FINGER/A-1 PULLEY RIGHT RING FINGER (Right)  SURGEON:  Surgeon(s) and Role:    * Daryll Brod, MD - Primary  PHYSICIAN ASSISTANT:   ASSISTANTS: none   ANESTHESIA:   local, regional and IV sedation  EBL:  none BLOOD ADMINISTERED:none  DRAINS: none   LOCAL MEDICATIONS USED:  BUPIVICAINE   SPECIMEN:  No Specimen  DISPOSITION OF SPECIMEN:  N/A  COUNTS:  YES  TOURNIQUET:   Total Tourniquet Time Documented: Forearm (Right) - 17 minutes Total: Forearm (Right) - 17 minutes   DICTATION: .Other Dictation: Dictation Number [id number]  PLAN OF CARE: Discharge to home after PACU  PATIENT DISPOSITION:  PACU - hemodynamically stable.

## 2017-10-01 NOTE — Transfer of Care (Signed)
Immediate Anesthesia Transfer of Care Note  Patient: Kristine Cooper  Procedure(s) Performed: RELEASE TRIGGER FINGER/A-1 PULLEY RIGHT RING FINGER (Right Finger)  Patient Location: PACU  Anesthesia Type:MAC and Bier block  Level of Consciousness: awake, alert  and oriented  Airway & Oxygen Therapy: Patient Spontanous Breathing  Post-op Assessment: Report given to RN and Post -op Vital signs reviewed and stable  Post vital signs: Reviewed and stable  Last Vitals:  Vitals:   10/01/17 0902 10/01/17 0903  BP: 121/75   Pulse:  70  Resp:  11  Temp:    SpO2:  100%    Last Pain:  Vitals:   10/01/17 0752  PainSc: 0-No pain         Complications: No apparent anesthesia complications

## 2017-10-01 NOTE — Op Note (Signed)
Dictation Number [id number]

## 2017-10-01 NOTE — Discharge Instructions (Addendum)

## 2017-10-01 NOTE — H&P (Signed)
Kristine Cooper is an 71 y.o. female.   Chief Complaint: catching right ring finger [id number] is a 71yo female with catching of her rigght ring finger  She had an injection for the second time. She states that it did not help. Her first injection gave her good relief of symptoms. She complains of discomfort with movement of her right ring finger with catching. Pain is mild in nature. Localizes over the metacarpal phalangeal joint with triggering of the finger. She has had no new injury. She has no history of diabetes. She does have a history of thyroid problems. There is no history of arthritis or gout. He has had a release on her left ring finger which has done extremely well. VAS score is 6/10 sharp in nature when it occurs. She is not taking anything for this. She has been wearing a Band-Aid splint at night without relief. She is complaining of mild pain discomfort in the basilar area of her left thumb with use. Localized over the carpometacarpal joint.      Past Medical History:  Diagnosis Date  . Abnormal stress test 07/16/2013   a. in 2014 had abnormal nuc which lead to a LHC 07/2013: moderate proximal LAD and RCA calcification without obstruction, widely patent cors, with systolic compression/bridging in the mid to distal LAD segment, normal EF.  [name] Arthritis    hands  . Exercise-induced asthma 10/16/2013  . Hyperlipidemia   . Hypertension   . Hypothyroidism    Multinodular goiter  . LBBB (left bundle branch block)    a. Rate related LBBB noted on nuc 09/2015.  [name] Restless leg syndrome   . Upper airway cough syndrome 12/10/2013    Past Surgical History:  Procedure Laterality Date  . BREAST EXCISIONAL BIOPSY Left 2000  . CARPAL TUNNEL RELEASE Bilateral   . CESAREAN SECTION     x2  . LIPOMA EXCISION     From back  . PARATHYROIDECTOMY     Partial  . THYROIDECTOMY, PARTIAL    . TONSILLECTOMY    . TRIGGER FINGER RELEASE Left 03/20/2016   Procedure: RELEASE A-1 PULLEY LEFT RING  FINGER;  Surgeon: Daryll Brod, MD;  Location: [facility];  Service: Orthopedics;  Laterality: Left;  ANESTHESIA: IV REGIONAL FAB    Family History  Problem Relation Age of Onset  . Emphysema Mother   . Hyperlipidemia Father   . Liver disease Father   . Cancer Father        SKIN  . Stroke Sister   . Heart disease Brother 67       stents  . Cancer Brother        SKIN, TESTICULAR  . Heart attack Brother   . Hypertension Neg Hx    Social History:  reports that  has never smoked. she has never used smokeless tobacco. She reports that she drinks alcohol. She reports that she does not use drugs.  Allergies:  Allergies  Allergen Reactions  . Codeine Sulfate Nausea And Vomiting  . Demerol [Meperidine] Nausea And Vomiting    No medications prior to admission.    No results found for this or any previous visit (from the past 48 hour(s)).  No results found.   Pertinent items are noted in HPI.  Height 4' 11.5" (1.511 m), weight 55.8 kg (123 lb).  General appearance: alert, cooperative and appears stated age Head: Normocephalic, without obvious abnormality Neck: no JVD Resp: clear to auscultation bilaterally Cardio: regular rate and rhythm, S1, S2  normal, no murmur, click, rub or gallop GI: soft, non-tender; bowel sounds normal; no masses,  no organomegaly Extremities: catching right ring finger Pulses: 2+ and symmetric Skin: Skin color, texture, turgor normal. No rashes or lesions Neurologic: Grossly normal Incision/Wound: na  Assessment/Plan Assessment:  1. Trigger ring finger of right hand    Plan: We have discussed 2 injections for her trigger finger. I would recommend surgical release. Prepare postoperative course are discussed along with risk complications. She is aware that there is no guarantee to the surgery the possibility of infection recurrence injury to arteries nerves tendons incomplete relief symptoms and dystrophy. She would like to proceed.  She is scheduled for release A1 pulley as an outpatient under regional anesthesia right ring finger. Questions are encouraged and answered her satisfaction.       Raneshia Derick R 10/01/2017, 4:24 AM

## 2017-10-01 NOTE — Anesthesia Postprocedure Evaluation (Signed)
Anesthesia Post Note  Patient: Kristine Cooper  Procedure(s) Performed: RELEASE TRIGGER FINGER/A-1 PULLEY RIGHT RING FINGER (Right Finger)     Patient location during evaluation: PACU Anesthesia Type: MAC Level of consciousness: awake and alert Pain management: pain level controlled Vital Signs Assessment: post-procedure vital signs reviewed and stable Respiratory status: spontaneous breathing, nonlabored ventilation, respiratory function stable and patient connected to nasal cannula oxygen Cardiovascular status: stable and blood pressure returned to baseline Postop Assessment: no apparent nausea or vomiting Anesthetic complications: no    Last Vitals:  Vitals:   10/01/17 0930 10/01/17 0951  BP: 127/74 (!) 155/85  Pulse: 60 71  Resp: 14 16  Temp:  36.4 C  SpO2: 97% 98%    Last Pain:  Vitals:   10/01/17 0752  PainSc: 0-No pain                 Tavonte Seybold EDWARD

## 2017-10-01 NOTE — Anesthesia Procedure Notes (Signed)
Anesthesia Regional Block: Bier block (IV Regional)   Pre-Anesthetic Checklist: ,, timeout performed, Correct Patient, Correct Site, Correct Laterality, Correct Procedure, Correct Position, site marked, Risks and benefits discussed, Surgical consent,  Pre-op evaluation,  At surgeon's request  Laterality: Right  Prep: chloraprep       Needles:  Injection technique: Single-shot      Additional Needles:   Procedures:,,,,, intact distal pulses, Esmarch exsanguination, single tourniquet utilized, #20gu IV placed  Narrative:  Start time: 10/01/2017 8:38 AM End time: 10/01/2017 8:38 AM  Performed by: Personally

## 2017-10-02 ENCOUNTER — Encounter (HOSPITAL_BASED_OUTPATIENT_CLINIC_OR_DEPARTMENT_OTHER): Payer: Self-pay | Admitting: Orthopedic Surgery

## 2017-10-02 NOTE — Op Note (Signed)
Kristine Cooper, SITZER               ACCOUNT NO.:  [id number]  MEDICAL RECORD NO.:  [id number]  LOCATION:                                 FACILITY:  PHYSICIAN:  Daryll Brod, M.D.            DATE OF BIRTH:  DATE OF PROCEDURE:  10/01/2017 DATE OF DISCHARGE:                              OPERATIVE REPORT   PLACE OF SURGERY:  [facility].  PREOPERATIVE DIAGNOSIS:  Stenosing tenosynovitis, right ring finger.  POSTOPERATIVE DIAGNOSES: 1. Stenosing tenosynovitis, right ring finger. 2. Flexor sheath cyst.  OPERATIONS:  Release of A1 pulley, right ring finger with excision of flexor sheath cyst, right ring finger flexor sheath.  SURGEON:  Daryll Brod, M.D.  ANESTHESIA:  Forearm IV regional with sedation and local infiltration.  HISTORY:  The patient is a 71 year old female with a history of triggering right ring finger.  This has been injected without relief. Conservative treatment has not afforded relief of symptoms, and she has elected to undergo surgical release of the A1 pulley.  Pre, peri, postoperative course have been discussed along with risks and complications.  She is aware there is no guarantee to the surgery with possibility of infection; recurrence of injury to arteries, nerves, tendons; incomplete relief of symptoms; and dystrophy.  In the preoperative area, the patient was seen, the extremity marked by both the patient and surgeon.  Antibiotic given.  DESCRIPTION OF PROCEDURE:  The patient was brought to the operating room, where a forearm-based IV regional anesthetic was carried out without difficulty.  She was prepped using ChloraPrep in a supine position with the right arm free.  A 3-minute dry time was taken.  A time-out taken, confirming the patient and procedure.  An oblique incision was made over the A1 pulley of the right ring finger.  This was carried down through subcutaneous tissue.  Bleeders were electrocauterized as necessary with bipolar.  The  sheath was identified. A cyst was present, which was excised.  The A1 pulley was then released on its radial aspect.  A small incision was made centrally in A2. Partial tenosynovectomy performed proximally with separation of the 2 tendons, removed any adhesions between the tenosynovial tissue.  The finger was placed through a full passive range of motion.  No further triggering was noted.  The wound was copiously irrigated with saline. The skin was then closed with interrupted 4-0 nylon sutures.  A local infiltration with 0.25% bupivacaine without epinephrine was given, approximately 5 mL was used.  A sterile compressive dressing with the fingers free was applied. On deflation of the tourniquet, all fingers immediately pinked.  She was taken to the recovery room for observation in satisfactory condition. She will be discharged home to return to the [facility] in 1 week's time on [facility].          ______________________________ Daryll Brod, M.D.     GK/MEDQ  D:  10/01/2017  T:  10/02/2017  Job:  [id number]

## 2017-10-16 DIAGNOSIS — L821 Other seborrheic keratosis: Secondary | ICD-10-CM | POA: Diagnosis not present

## 2017-10-16 DIAGNOSIS — I788 Other diseases of capillaries: Secondary | ICD-10-CM | POA: Diagnosis not present

## 2017-10-16 DIAGNOSIS — D1801 Hemangioma of skin and subcutaneous tissue: Secondary | ICD-10-CM | POA: Diagnosis not present

## 2017-10-16 DIAGNOSIS — D2271 Melanocytic nevi of right lower limb, including hip: Secondary | ICD-10-CM | POA: Diagnosis not present

## 2017-10-16 DIAGNOSIS — D225 Melanocytic nevi of trunk: Secondary | ICD-10-CM | POA: Diagnosis not present

## 2017-10-16 DIAGNOSIS — L570 Actinic keratosis: Secondary | ICD-10-CM | POA: Diagnosis not present

## 2017-10-16 DIAGNOSIS — L309 Dermatitis, unspecified: Secondary | ICD-10-CM | POA: Diagnosis not present

## 2017-12-09 DIAGNOSIS — M7062 Trochanteric bursitis, left hip: Secondary | ICD-10-CM | POA: Diagnosis not present

## 2017-12-09 DIAGNOSIS — M79605 Pain in left leg: Secondary | ICD-10-CM | POA: Diagnosis not present

## 2017-12-09 DIAGNOSIS — M7632 Iliotibial band syndrome, left leg: Secondary | ICD-10-CM | POA: Diagnosis not present

## 2017-12-09 DIAGNOSIS — G5702 Lesion of sciatic nerve, left lower limb: Secondary | ICD-10-CM | POA: Diagnosis not present

## 2017-12-27 ENCOUNTER — Other Ambulatory Visit: Payer: Self-pay | Admitting: Orthopedic Surgery

## 2017-12-27 DIAGNOSIS — M65341 Trigger finger, right ring finger: Secondary | ICD-10-CM

## 2018-01-09 ENCOUNTER — Ambulatory Visit
Admission: RE | Admit: 2018-01-09 | Discharge: 2018-01-09 | Disposition: A | Discharge status: Home / Self Care | Payer: Medicare Other | Source: Ambulatory Visit | Attending: Orthopedic Surgery | Admitting: Orthopedic Surgery

## 2018-01-09 DIAGNOSIS — M65341 Trigger finger, right ring finger: Secondary | ICD-10-CM

## 2018-01-09 DIAGNOSIS — M7989 Other specified soft tissue disorders: Secondary | ICD-10-CM | POA: Diagnosis not present

## 2018-01-13 ENCOUNTER — Other Ambulatory Visit: Payer: Self-pay | Admitting: Orthopedic Surgery

## 2018-01-13 DIAGNOSIS — M65322 Trigger finger, left index finger: Secondary | ICD-10-CM

## 2018-01-15 DIAGNOSIS — M1812 Unilateral primary osteoarthritis of first carpometacarpal joint, left hand: Secondary | ICD-10-CM | POA: Diagnosis not present

## 2018-01-15 DIAGNOSIS — M65351 Trigger finger, right little finger: Secondary | ICD-10-CM | POA: Diagnosis not present

## 2018-01-20 ENCOUNTER — Other Ambulatory Visit: Payer: Self-pay | Admitting: Obstetrics and Gynecology

## 2018-01-20 DIAGNOSIS — Z1231 Encounter for screening mammogram for malignant neoplasm of breast: Secondary | ICD-10-CM

## 2018-02-18 DIAGNOSIS — H00015 Hordeolum externum left lower eyelid: Secondary | ICD-10-CM | POA: Diagnosis not present

## 2018-02-25 ENCOUNTER — Ambulatory Visit
Admission: RE | Admit: 2018-02-25 | Discharge: 2018-02-25 | Disposition: A | Discharge status: Home / Self Care | Payer: Medicare Other | Source: Ambulatory Visit | Attending: Obstetrics and Gynecology | Admitting: Obstetrics and Gynecology

## 2018-02-25 DIAGNOSIS — Z1231 Encounter for screening mammogram for malignant neoplasm of breast: Secondary | ICD-10-CM | POA: Diagnosis not present

## 2018-02-26 DIAGNOSIS — M65351 Trigger finger, right little finger: Secondary | ICD-10-CM | POA: Diagnosis not present

## 2018-02-26 DIAGNOSIS — M1812 Unilateral primary osteoarthritis of first carpometacarpal joint, left hand: Secondary | ICD-10-CM | POA: Diagnosis not present

## 2018-03-04 DIAGNOSIS — E7800 Pure hypercholesterolemia, unspecified: Secondary | ICD-10-CM | POA: Diagnosis not present

## 2018-03-04 DIAGNOSIS — E200 Idiopathic hypoparathyroidism: Secondary | ICD-10-CM | POA: Diagnosis not present

## 2018-03-21 DIAGNOSIS — J22 Unspecified acute lower respiratory infection: Secondary | ICD-10-CM | POA: Diagnosis not present

## 2018-04-14 DIAGNOSIS — Z6825 Body mass index (BMI) 25.0-25.9, adult: Secondary | ICD-10-CM | POA: Diagnosis not present

## 2018-04-14 DIAGNOSIS — N952 Postmenopausal atrophic vaginitis: Secondary | ICD-10-CM | POA: Diagnosis not present

## 2018-04-14 DIAGNOSIS — Z01419 Encounter for gynecological examination (general) (routine) without abnormal findings: Secondary | ICD-10-CM | POA: Diagnosis not present

## 2018-05-02 DIAGNOSIS — H68131 Extrinsic cartilagenous obstruction of Eustachian tube, right ear: Secondary | ICD-10-CM | POA: Diagnosis not present

## 2018-05-02 DIAGNOSIS — H9191 Unspecified hearing loss, right ear: Secondary | ICD-10-CM | POA: Diagnosis not present

## 2018-05-30 DIAGNOSIS — Z822 Family history of deafness and hearing loss: Secondary | ICD-10-CM | POA: Diagnosis not present

## 2018-05-30 DIAGNOSIS — H90A32 Mixed conductive and sensorineural hearing loss, unilateral, left ear with restricted hearing on the contralateral side: Secondary | ICD-10-CM | POA: Diagnosis not present

## 2018-05-30 DIAGNOSIS — H90A21 Sensorineural hearing loss, unilateral, right ear, with restricted hearing on the contralateral side: Secondary | ICD-10-CM | POA: Diagnosis not present

## 2018-06-16 DIAGNOSIS — H2513 Age-related nuclear cataract, bilateral: Secondary | ICD-10-CM | POA: Diagnosis not present

## 2018-06-16 DIAGNOSIS — H43813 Vitreous degeneration, bilateral: Secondary | ICD-10-CM | POA: Diagnosis not present

## 2018-07-24 DIAGNOSIS — Z23 Encounter for immunization: Secondary | ICD-10-CM | POA: Diagnosis not present

## 2018-09-01 DIAGNOSIS — M1812 Unilateral primary osteoarthritis of first carpometacarpal joint, left hand: Secondary | ICD-10-CM | POA: Diagnosis not present

## 2018-09-01 DIAGNOSIS — M65351 Trigger finger, right little finger: Secondary | ICD-10-CM | POA: Diagnosis not present

## 2018-09-01 DIAGNOSIS — J209 Acute bronchitis, unspecified: Secondary | ICD-10-CM | POA: Diagnosis not present

## 2018-09-03 ENCOUNTER — Ambulatory Visit (INDEPENDENT_AMBULATORY_CARE_PROVIDER_SITE_OTHER): Payer: Medicare Other | Admitting: Podiatry

## 2018-09-03 ENCOUNTER — Encounter: Payer: Self-pay | Admitting: Podiatry

## 2018-09-03 VITALS — BP 145/85 | HR 67 | Resp 16

## 2018-09-03 DIAGNOSIS — M779 Enthesopathy, unspecified: Secondary | ICD-10-CM

## 2018-09-03 MED ORDER — TRIAMCINOLONE ACETONIDE 10 MG/ML IJ SUSP
10.0000 mg | Freq: Once | INTRAMUSCULAR | Status: AC
  Administered 2018-09-03: 10 mg

## 2018-09-03 NOTE — Progress Notes (Signed)
   Subjective:    Patient ID: Kristine Cooper, female    DOB: [birth year], 72 y.o.   MRN: [id number]  HPI    Review of Systems  All other systems reviewed and are negative.      Objective:   Physical Exam        Assessment & Plan:

## 2018-09-04 NOTE — Progress Notes (Signed)
Subjective:   Patient ID: Kristine Cooper, female   DOB: 72 y.o.   MRN: [id number]   HPI Patient presents with significant discomfort plantar aspect right fifth metatarsal with lesion formation and pain when patient is walking.  Patient feels like she is walking on the ball and she feels like there is fluid and it has been sore for a fairly long period of time and worse over the last 6 months.  Patient does not smoke and likes to be active   Review of Systems  All other systems reviewed and are negative.       Objective:  Physical Exam  Constitutional: She appears well-developed and well-nourished.  Cardiovascular: Intact distal pulses.  Pulmonary/Chest: Effort normal.  Musculoskeletal: Normal range of motion.  Neurological: She is alert.  Skin: Skin is warm.  Nursing note and vitals reviewed.   Neurovascular status found to be intact muscle strength is adequate range of motion within normal limits with patient found to have inflammation fluid around the fifth MPJ right with keratotic lesion formation and pain with palpation.  Patient is noted to have good digital perfusion is well oriented x3 and has relatively prominent metatarsal head and within this area     Assessment:  Inflammatory capsulitis fifth MPJ right with porokeratotic type lesion     Plan:  H&P conditions reviewed and today I went ahead and did a plantar capsular injection after sterile prep of the area 1.5 mg Dexasone Kenalog 3 mg Xylocaine allowed numbness to occur and did sterile debridement of the lesion and advised on reappoint when symptoms were to recur.  Ultimately may require surgical intervention depending on response signed visit

## 2018-09-08 DIAGNOSIS — E200 Idiopathic hypoparathyroidism: Secondary | ICD-10-CM | POA: Diagnosis not present

## 2018-09-08 DIAGNOSIS — E7800 Pure hypercholesterolemia, unspecified: Secondary | ICD-10-CM | POA: Diagnosis not present

## 2018-09-08 DIAGNOSIS — Z0000 Encounter for general adult medical examination without abnormal findings: Secondary | ICD-10-CM | POA: Diagnosis not present

## 2018-09-08 DIAGNOSIS — E039 Hypothyroidism, unspecified: Secondary | ICD-10-CM | POA: Diagnosis not present

## 2018-09-08 DIAGNOSIS — M810 Age-related osteoporosis without current pathological fracture: Secondary | ICD-10-CM | POA: Diagnosis not present

## 2018-09-08 DIAGNOSIS — J449 Chronic obstructive pulmonary disease, unspecified: Secondary | ICD-10-CM | POA: Diagnosis not present

## 2018-09-08 DIAGNOSIS — R0789 Other chest pain: Secondary | ICD-10-CM | POA: Diagnosis not present

## 2018-09-08 DIAGNOSIS — G2581 Restless legs syndrome: Secondary | ICD-10-CM | POA: Diagnosis not present

## 2018-09-08 DIAGNOSIS — J309 Allergic rhinitis, unspecified: Secondary | ICD-10-CM | POA: Diagnosis not present

## 2018-09-08 DIAGNOSIS — I10 Essential (primary) hypertension: Secondary | ICD-10-CM | POA: Diagnosis not present

## 2018-11-26 ENCOUNTER — Ambulatory Visit (INDEPENDENT_AMBULATORY_CARE_PROVIDER_SITE_OTHER): Payer: Medicare Other | Admitting: Pulmonary Disease

## 2018-11-26 ENCOUNTER — Encounter: Payer: Self-pay | Admitting: Pulmonary Disease

## 2018-11-26 ENCOUNTER — Ambulatory Visit (INDEPENDENT_AMBULATORY_CARE_PROVIDER_SITE_OTHER)
Admission: RE | Admit: 2018-11-26 | Discharge: 2018-11-26 | Disposition: A | Discharge status: Home / Self Care | Payer: Medicare Other | Source: Ambulatory Visit | Attending: Pulmonary Disease | Admitting: Pulmonary Disease

## 2018-11-26 VITALS — BP 136/80 | HR 81 | Ht 59.5 in | Wt 125.2 lb

## 2018-11-26 DIAGNOSIS — R0602 Shortness of breath: Secondary | ICD-10-CM | POA: Diagnosis not present

## 2018-11-26 DIAGNOSIS — J4550 Severe persistent asthma, uncomplicated: Secondary | ICD-10-CM

## 2018-11-26 LAB — CBC WITH DIFFERENTIAL/PLATELET
Basophils Absolute: 0.1 10*3/uL (ref 0.0–0.1)
Basophils Relative: 1 % (ref 0.0–3.0)
EOS PCT: 1.9 % (ref 0.0–5.0)
Eosinophils Absolute: 0.1 10*3/uL (ref 0.0–0.7)
HCT: 41 % (ref 36.0–46.0)
Hemoglobin: 13.8 g/dL (ref 12.0–15.0)
LYMPHS ABS: 1.4 10*3/uL (ref 0.7–4.0)
LYMPHS PCT: 24.8 % (ref 12.0–46.0)
MCHC: 33.6 g/dL (ref 30.0–36.0)
MCV: 89.7 fl (ref 78.0–100.0)
MONOS PCT: 8.4 % (ref 3.0–12.0)
Monocytes Absolute: 0.5 10*3/uL (ref 0.1–1.0)
NEUTROS ABS: 3.7 10*3/uL (ref 1.4–7.7)
NEUTROS PCT: 63.9 % (ref 43.0–77.0)
PLATELETS: 295 10*3/uL (ref 150.0–400.0)
RBC: 4.57 Mil/uL (ref 3.87–5.11)
RDW: 13.5 % (ref 11.5–15.5)
WBC: 5.8 10*3/uL (ref 4.0–10.5)

## 2018-11-26 LAB — NITRIC OXIDE: Nitric Oxide: 24

## 2018-11-26 NOTE — Addendum Note (Signed)
Addended by: Suzzanne Cloud E on: 11/26/2018 11:14 AM   Modules accepted: Orders

## 2018-11-26 NOTE — Progress Notes (Signed)
   Subjective:    Patient ID: Kristine Cooper, female    DOB: Mar 13, 1946, 73 y.o.   MRN: [id number]  HPI    Review of Systems  Constitutional: Negative for fever and unexpected weight change.  HENT: Positive for sneezing and sore throat. Negative for congestion, dental problem, ear pain, nosebleeds, postnasal drip, rhinorrhea, sinus pressure and trouble swallowing.   Eyes: Negative for redness and itching.  Respiratory: Positive for cough, chest tightness, shortness of breath and wheezing.   Cardiovascular: Negative for palpitations and leg swelling.  Gastrointestinal: Negative for nausea and vomiting.  Genitourinary: Negative for dysuria.  Musculoskeletal: Negative for joint swelling.  Skin: Negative for rash.  Allergic/Immunologic: Positive for food allergies. Negative for environmental allergies and immunocompromised state.  Neurological: Negative for headaches.  Hematological: Does not bruise/bleed easily.  Psychiatric/Behavioral: Negative for dysphoric mood. The patient is not nervous/anxious.        Objective:   Physical Exam        Assessment & Plan:

## 2018-11-26 NOTE — Patient Instructions (Signed)
Lab tests and chest xray today  Will arrange for pulmonary function test   Follow up in 2 to 3 weeks

## 2018-11-26 NOTE — Progress Notes (Signed)
[facility]  Chief Complaint  Patient presents with  . Consult    increasing SOB 2 months - hacky cough - has had long term rib pain also    Constitutional:  BP 136/80 (BP Location: Right Arm, Patient Position: Sitting, Cuff Size: Normal)   Pulse 81   Ht 4' 11.5" (1.511 m)   Wt 125 lb 3.2 oz (56.8 kg)   SpO2 97%   BMI 24.86 kg/m   Past Medical History:  RLS, LBBB, Hypothyroidism, HTN, HLD, OA  Brief Summary:  Kristine Cooper is a 73 y.o. female with dyspnea on exertion.  I last saw her in 2015.  At time the concern was for allergy and irritant induced asthma, exercise induced asthma, and upper airway cough.  She continues to have trouble with her breathing.  She feels tight in her chest.  She gets a cough that is usually dry, but sometimes brings up sputum.  She will also hear herself wheeze.  She wakes up sometimes with a cough.  She uses albuterol 1 or 2 times every few weeks.  This helps, but she is reluctant to use it more.  She feels more trouble around strong smells, hot/humid weather, or cold/windy weather.  She gets sneezing occasionally and her eyes water if she is outside too long.  She isn't having sinus congestion or drainage at present, but does occasionally get scratchy throat.  She denies reflux, or skin rash.  No leg swelling.  Had pneumonia in her left lung few years ago, and had left rib fracture around then also (November 2016).  She still teaches at preschool as a substitute.   Physical Exam:   Appearance - well kempt   ENMT - clear nasal mucosa, midline nasal  septum, no oral exudates, no LAN, trachea midline  Respiratory - normal chest wall, normal respiratory effort, no accessory muscle use, no wheeze/rales  CV - s1s2 regular rate and rhythm, no murmurs, no peripheral edema, radial pulses symmetric  GI - soft, non tender, no masses  Lymph - no adenopathy noted in neck and axillary areas  MSK - normal  gait  Ext - no cyanosis, clubbing, or joint inflammation noted  Skin - no rashes, lesions, or ulcers  Neuro - normal strength, oriented x 3  Psych - normal mood and affect  Discussion:  Her symptoms description is still concerning for persistent asthma.  She might also have an allergic component.  Assessment/Plan:   Persistent asthma. - check CBC with diff, RAST with IgE, PFT, and CXR - continue prn albuterol for now - depending on test findigs, will determine additional diagnostics and therapeutics  Left sided chest pain with history of left sided pneumonia and rib fracture. - chest xray today   Patient Instructions  Lab tests and chest xray today  Will arrange for pulmonary function test   Follow up in 2 to 3 weeks    Chesley Mires, MD [place] Pager: [phone] 11/26/2018, 10:55 AM  Flow Sheet     Pulmonary tests:  Spirometry 07/07/08 >> FEV1 2.29 (118%), FEV1% 80, borderline BD from FEF 25-75%  Spirometry 10/16/13 >> FEV1 2.08 (107%), FEV1% 81 FeNO 11/26/18 >> 24  Chest imaging:  CT angio chest 09/01/15 >> 5 mm calcified granuloma RUL, mild scar RML and lingula, subtle mosaic attenuation (reviewed by me)  Cardiac tests:  Lt heart cath 07/16/13 >> Proximal LAD and RCA calcification, no coronary obstruction, normal LV function   Review  of Systems:  Constitutional: Negative for fever and unexpected weight change.  HENT: Positive for sneezing and sore throat. Negative for congestion, dental problem, ear pain, nosebleeds, postnasal drip, rhinorrhea, sinus pressure and trouble swallowing.   Eyes: Negative for redness and itching.  Respiratory: Positive for cough, chest tightness, shortness of breath and wheezing.   Cardiovascular: Negative for palpitations and leg swelling.  Gastrointestinal: Negative for nausea and vomiting.  Genitourinary: Negative for dysuria.  Musculoskeletal: Negative for joint swelling.  Skin: Negative for rash.   Allergic/Immunologic: Positive for food allergies. Negative for environmental allergies and immunocompromised state.  Neurological: Negative for headaches.  Hematological: Does not bruise/bleed easily.  Psychiatric/Behavioral: Negative for dysphoric mood. The patient is not nervous/anxious.    Medications:   Allergies as of 11/26/2018      Reactions   Codeine Sulfate Nausea And Vomiting   Demerol [meperidine] Nausea And Vomiting      Medication List       Accurate as of November 26, 2018 10:55 AM. Always use your most recent med list.        albuterol 108 (90 Base) MCG/ACT inhaler Commonly known as:  PROVENTIL HFA;VENTOLIN HFA Inhale every 6 (six) hours as needed into the lungs for wheezing or shortness of breath.   amLODipine 2.5 MG tablet Commonly known as:  NORVASC TAKE 1 TABLET BY MOUTH  DAILY   betamethasone dipropionate 0.05 % cream Commonly known as:  DIPROLENE Apply 0.05 % topically as needed.   calcitRIOL 0.5 MCG capsule Commonly known as:  ROCALTROL Take 1 mcg by mouth daily. Takes 2 daily   levothyroxine 50 MCG tablet Commonly known as:  SYNTHROID, LEVOTHROID Take 50 mcg by mouth daily before breakfast.   multivitamin tablet Take 1 tablet by mouth daily.   pramipexole 0.25 MG tablet Commonly known as:  MIRAPEX Take 0.25 mg by mouth at bedtime and may repeat dose one time if needed.       Past Surgical History:  She  has a past surgical history that includes Thyroidectomy, partial; Parathyroidectomy; Lipoma excision; Cesarean section; Tonsillectomy; Carpal tunnel release (Bilateral); Trigger finger release (Left, 03/20/2016); Breast excisional biopsy (Left, 2000); and Trigger finger release (Right, 10/01/2017).  Family History:  Her family history includes Cancer in her brother and father; Emphysema in her mother; Heart attack in her brother; Heart disease (age of onset: 97) in her brother; Hyperlipidemia in her father; Liver disease in her father;  Stroke in her sister.  Social History:  She  reports that she has never smoked. She has never used smokeless tobacco. She reports current alcohol use. She reports that she does not use drugs.

## 2018-11-29 LAB — ALLERGEN PROFILE, PERENNIAL ALLERGEN IGE
Alternaria Alternata IgE: <0.1 kU/L
Aspergillus Fumigatus IgE: <0.1 kU/L
Cat Dander IgE: <0.1 kU/L
Chicken Feathers IgE: <0.1 kU/L
D Farinae IgE: 2.32 kU/L — AB
D001-IGE D PTERONYSSINUS: 1.95 kU/L — AB
Dog Dander IgE: <0.1 kU/L
Goose Feathers IgE: <0.1 kU/L
Mucor Racemosus IgE: <0.1 kU/L
Setomelanomma Rostrat: <0.1 kU/L

## 2018-12-02 DIAGNOSIS — I788 Other diseases of capillaries: Secondary | ICD-10-CM | POA: Diagnosis not present

## 2018-12-02 DIAGNOSIS — L84 Corns and callosities: Secondary | ICD-10-CM | POA: Diagnosis not present

## 2018-12-02 DIAGNOSIS — L821 Other seborrheic keratosis: Secondary | ICD-10-CM | POA: Diagnosis not present

## 2018-12-02 DIAGNOSIS — D224 Melanocytic nevi of scalp and neck: Secondary | ICD-10-CM | POA: Diagnosis not present

## 2018-12-02 DIAGNOSIS — D1801 Hemangioma of skin and subcutaneous tissue: Secondary | ICD-10-CM | POA: Diagnosis not present

## 2018-12-02 DIAGNOSIS — L814 Other melanin hyperpigmentation: Secondary | ICD-10-CM | POA: Diagnosis not present

## 2018-12-02 DIAGNOSIS — L570 Actinic keratosis: Secondary | ICD-10-CM | POA: Diagnosis not present

## 2018-12-02 DIAGNOSIS — D2271 Melanocytic nevi of right lower limb, including hip: Secondary | ICD-10-CM | POA: Diagnosis not present

## 2018-12-02 DIAGNOSIS — L918 Other hypertrophic disorders of the skin: Secondary | ICD-10-CM | POA: Diagnosis not present

## 2018-12-02 DIAGNOSIS — L603 Nail dystrophy: Secondary | ICD-10-CM | POA: Diagnosis not present

## 2018-12-04 ENCOUNTER — Telehealth: Payer: Self-pay | Admitting: Pulmonary Disease

## 2018-12-04 NOTE — Telephone Encounter (Signed)
CBC    Component Value Date/Time   WBC 5.8 11/26/2018 1115   RBC 4.57 11/26/2018 1115   HGB 13.8 11/26/2018 1115   HCT 41.0 11/26/2018 1115   PLT 295.0 11/26/2018 1115   MCV 89.7 11/26/2018 1115   MCHC 33.6 11/26/2018 1115   RDW 13.5 11/26/2018 1115   LYMPHSABS 1.4 11/26/2018 1115   MONOABS 0.5 11/26/2018 1115   EOSABS 0.1 11/26/2018 1115   BASOSABS 0.1 11/26/2018 1115    RAST 11/26/18 >> dust mites   Dg Chest 2 View  Result Date: 11/26/2018 CLINICAL DATA:  Not well controlled severe asthma. Short of breath on exertion EXAM: CHEST - 2 VIEW COMPARISON:  09/02/2017 FINDINGS: Small calcified granuloma right upper lobe unchanged. Surgical clips in the thyroid bed bilaterally unchanged. Linear density in the right middle lobe is new and may be scarring or atelectasis. Negative for pneumonia or effusion. Heart size normal. Negative for heart failure or edema. IMPRESSION: Mild linear density in the right middle lobe likely atelectasis or scarring. This is a new finding. No lobar infiltrate. Electronically Signed   By: Franchot Gallo M.D.   On: 11/26/2018 15:25    Please let her know that her chest xray showed very small area scarring, likely from prior respiratory infection.  This shouldn't cause any breathing issues.  Her lab test showed sensitivity to dust mites.    Will discuss in more detail at her next visit on 12/17/18.

## 2018-12-05 NOTE — Telephone Encounter (Signed)
Attempted to call pt but unable to reach her. Left message for pt to return call. 

## 2018-12-05 NOTE — Telephone Encounter (Signed)
Called and spoke with patient regarding results.  Informed the patient of results and recommendations today. Pt verbalized understanding and denied any questions or concerns at this time.  Nothing further needed.  

## 2018-12-05 NOTE — Telephone Encounter (Signed)
Pt is calling back [phone]

## 2018-12-05 NOTE — Telephone Encounter (Signed)
Attempted to call patient today regarding results. I did not receive an answer at time of call. I have left a voicemail message for pt to return call. X2  

## 2018-12-17 ENCOUNTER — Encounter: Payer: Self-pay | Admitting: Pulmonary Disease

## 2018-12-17 ENCOUNTER — Ambulatory Visit (INDEPENDENT_AMBULATORY_CARE_PROVIDER_SITE_OTHER): Payer: Medicare Other | Admitting: Pulmonary Disease

## 2018-12-17 VITALS — BP 122/84 | HR 70 | Ht 59.0 in | Wt 126.0 lb

## 2018-12-17 DIAGNOSIS — J4550 Severe persistent asthma, uncomplicated: Secondary | ICD-10-CM

## 2018-12-17 DIAGNOSIS — R0600 Dyspnea, unspecified: Secondary | ICD-10-CM

## 2018-12-17 DIAGNOSIS — R0609 Other forms of dyspnea: Secondary | ICD-10-CM | POA: Diagnosis not present

## 2018-12-17 LAB — PULMONARY FUNCTION TEST
DL/VA % PRED: 95 %
DL/VA: 4.11 ml/min/mmHg/L
DLCO COR: 15.2 ml/min/mmHg
DLCO cor % pred: 92 %
DLCO unc % pred: 93 %
DLCO unc: 15.39 ml/min/mmHg
FEF 25-75 PRE: 1.91 L/s
FEF 25-75 Post: 2.05 L/sec
FEF2575-%CHANGE-POST: 7 %
FEF2575-%PRED-POST: 125 %
FEF2575-%Pred-Pre: 116 %
FEV1-%CHANGE-POST: 2 %
FEV1-%PRED-PRE: 102 %
FEV1-%Pred-Post: 104 %
FEV1-POST: 1.91 L
FEV1-Pre: 1.88 L
FEV1FVC-%CHANGE-POST: 2 %
FEV1FVC-%Pred-Pre: 107 %
FEV6-%CHANGE-POST: 0 %
FEV6-%Pred-Post: 98 %
FEV6-%Pred-Pre: 98 %
FEV6-PRE: 2.29 L
FEV6-Post: 2.29 L
FEV6FVC-%Change-Post: 0 %
FEV6FVC-%PRED-PRE: 104 %
FEV6FVC-%Pred-Post: 104 %
FVC-%Change-Post: 0 %
FVC-%PRED-POST: 93 %
FVC-%PRED-PRE: 94 %
FVC-POST: 2.29 L
FVC-PRE: 2.3 L
POST FEV1/FVC RATIO: 84 %
Post FEV6/FVC ratio: 100 %
Pre FEV1/FVC ratio: 82 %
Pre FEV6/FVC Ratio: 100 %
RV % PRED: 120 %
RV: 2.35 L
TLC % PRED: 105 %
TLC: 4.59 L

## 2018-12-17 MED ORDER — MONTELUKAST SODIUM 10 MG PO TABS: 10.0000 mg | ORAL_TABLET | Freq: Every day | ORAL | 5 refills | Status: DC

## 2018-12-17 MED ORDER — BUDESONIDE-FORMOTEROL FUMARATE 80-4.5 MCG/ACT IN AERO: 2.0000 | INHALATION_SPRAY | Freq: Two times a day (BID) | RESPIRATORY_TRACT | 12 refills | Status: DC

## 2018-12-17 NOTE — Patient Instructions (Signed)
Symbicort two puffs in the morning and two puffs at night, and rinse mouth after each use  Montelukast (singulair) 10 mg pill nightly  Follow up in 4 weeks

## 2018-12-17 NOTE — Progress Notes (Signed)
PFT done today. 

## 2018-12-17 NOTE — Progress Notes (Signed)
[facility]  Chief Complaint  Patient presents with  . Follow-up    pt states breathing has not changed since last visit; had pft today and breathing treatment    Constitutional:  BP 122/84 (BP Location: Right Arm, Cuff Size: Normal)   Pulse 70   Ht 4\' 11"  (1.499 m)   Wt 126 lb (57.2 kg)   SpO2 94%   BMI 25.45 kg/m   Past Medical History:  RLS, LBBB, Hypothyroidism, HTN, HLD, OA  Brief Summary:  Kristine Cooper is a 73 y.o. female with dyspnea on exertion.  Still has chest soreness, especially on Lt side.  She had lipoma removed from that side wears ago, and felt similar.  She hasn't felt any lumps.  She still gets dry cough.  Had PFT today that was normal.  Labs showed sensitivity to dust mites.  CXR showed granuloma, and scarring on Rt (reviewed by me).  Physical Exam:   Appearance - well kempt   ENMT - no sinus tenderness, no nasal discharge, no oral exudate  Neck - no masses, trachea midline, no thyromegaly, no elevation in JVP  Respiratory - normal appearance of chest wall, normal respiratory effort w/o accessory muscle use, no dullness on percussion, no wheezing or rales  CV - s1s2 regular rate and rhythm, no murmurs, no peripheral edema, radial pulses symmetric  GI - soft, non tender  Lymph - no adenopathy noted in neck and axillary areas  MSK - normal gait  Ext - no cyanosis, clubbing, or joint inflammation noted  Skin - no rashes, lesions, or ulcers  Neuro - normal strength, oriented x 3  Psych - normal mood and affect   Assessment/Plan:   Dyspnea on exertion with cough concerning for persistent asthma. - will add symbicort and singulair - continue prn albuterol - if symptoms don't improve, then will need repeat CT chest  Left sided chest pain with history of left sided pneumonia, rib fracture, and lipoma removal. - if symptoms persist, then will need repeat CT chest   Patient Instructions  Symbicort  two puffs in the morning and two puffs at night, and rinse mouth after each use  Montelukast (singulair) 10 mg pill nightly  Follow up in 4 weeks    Chesley Mires, MD [facility] Pager: [phone] 12/17/2018, 12:14 PM  Flow Sheet     Pulmonary tests:  Spirometry 07/07/08 >> FEV1 2.29 (118%), FEV1% 80, borderline BD from FEF 25-75%  Spirometry 10/16/13 >> FEV1 2.08 (107%), FEV1% 81 FeNO 11/26/18 >> 24 RAST 11/26/18 >> dust mites PFT 12/17/18 >> FEV1 1.91 (104%), FEV1% 84, TLC 4.59 (105%), DLCO 93%   Chest imaging:  CT angio chest 09/01/15 >> 5 mm calcified granuloma RUL, mild scar RML and lingula, subtle mosaic attenuation (reviewed by me)  Cardiac tests:  Lt heart cath 07/16/13 >> Proximal LAD and RCA calcification, no coronary obstruction, normal LV function   Medications:   Allergies as of 12/17/2018      Reactions   Codeine Sulfate Nausea And Vomiting   Demerol [meperidine] Nausea And Vomiting      Medication List       Accurate as of December 17, 2018 12:14 PM. Always use your most recent med list.        albuterol 108 (90 Base) MCG/ACT inhaler Commonly known as:  PROVENTIL HFA;VENTOLIN HFA Inhale every 6 (six) hours as needed into the lungs for wheezing or shortness of breath.   amLODipine 2.5  MG tablet Commonly known as:  NORVASC TAKE 1 TABLET BY MOUTH  DAILY   betamethasone dipropionate 0.05 % cream Commonly known as:  DIPROLENE Apply 0.05 % topically as needed.   budesonide-formoterol 80-4.5 MCG/ACT inhaler Commonly known as:  SYMBICORT Inhale 2 puffs into the lungs 2 (two) times daily.   calcitRIOL 0.5 MCG capsule Commonly known as:  ROCALTROL Take 1 mcg by mouth daily. Takes 2 daily   levothyroxine 50 MCG tablet Commonly known as:  SYNTHROID, LEVOTHROID Take 50 mcg by mouth daily before breakfast.   montelukast 10 MG tablet Commonly known as:  SINGULAIR Take 1 tablet (10 mg total) by mouth at bedtime.   multivitamin  tablet Take 1 tablet by mouth daily.   pramipexole 0.25 MG tablet Commonly known as:  MIRAPEX Take 0.25 mg by mouth at bedtime and may repeat dose one time if needed.       Past Surgical History:  She  has a past surgical history that includes Thyroidectomy, partial; Parathyroidectomy; Lipoma excision; Cesarean section; Tonsillectomy; Carpal tunnel release (Bilateral); Trigger finger release (Left, 03/20/2016); Breast excisional biopsy (Left, 2000); and Trigger finger release (Right, 10/01/2017).  Family History:  Her family history includes Cancer in her brother and father; Emphysema in her mother; Heart attack in her brother; Heart disease (age of onset: 68) in her brother; Hyperlipidemia in her father; Liver disease in her father; Stroke in her sister.  Social History:  She  reports that she has never smoked. She has never used smokeless tobacco. She reports current alcohol use. She reports that she does not use drugs.

## 2019-01-12 ENCOUNTER — Other Ambulatory Visit: Payer: Self-pay | Admitting: Family Medicine

## 2019-01-12 DIAGNOSIS — Z1231 Encounter for screening mammogram for malignant neoplasm of breast: Secondary | ICD-10-CM

## 2019-01-14 ENCOUNTER — Encounter: Payer: Self-pay | Admitting: Pulmonary Disease

## 2019-01-14 ENCOUNTER — Ambulatory Visit (INDEPENDENT_AMBULATORY_CARE_PROVIDER_SITE_OTHER): Payer: Medicare Other | Admitting: Pulmonary Disease

## 2019-01-14 ENCOUNTER — Other Ambulatory Visit: Payer: Self-pay

## 2019-01-14 VITALS — BP 126/70 | HR 74 | Ht 59.0 in | Wt 123.6 lb

## 2019-01-14 DIAGNOSIS — R0600 Dyspnea, unspecified: Secondary | ICD-10-CM

## 2019-01-14 DIAGNOSIS — R0609 Other forms of dyspnea: Secondary | ICD-10-CM | POA: Diagnosis not present

## 2019-01-14 DIAGNOSIS — R053 Chronic cough: Secondary | ICD-10-CM

## 2019-01-14 DIAGNOSIS — R05 Cough: Secondary | ICD-10-CM | POA: Diagnosis not present

## 2019-01-14 NOTE — Patient Instructions (Signed)
Stop using symbicort  Continue singulair 10 mg pill nightly  Will schedule high resolution CT chest  Follow up in 8 weeks

## 2019-01-14 NOTE — Progress Notes (Signed)
[facility]  Chief Complaint  Patient presents with  . Follow-up    Pt has SOB with exertion, productive cough-yellow, no chest tightness no fever, no chills.    Constitutional:  BP 126/70 (BP Location: Left Arm, Cuff Size: Normal)   Pulse 74   Ht 4\' 11"  (1.499 m)   Wt 123 lb 9.6 oz (56.1 kg)   SpO2 95%   BMI 24.96 kg/m   Past Medical History:  RLS, LBBB, Hypothyroidism, HTN, HLD, OA  Brief Summary:  Kristine Cooper is a 73 y.o. female with dyspnea on exertion.  She felt great after first dose of singulair, but hasn't gotten that feeling since.  She doesn't feel like symbicort has helped, and she has been getting leg cramps since being on symbicort.  Still has cough and chest congestion.  Feels like phlegm comes half way up, and then gets stuck.  Not having fever, wheeze, skin rash, sinus congestion, post nasal drip, or reflux.  Gets coughing spells when exercising also and feels short of breath at times.  Still gets discomfort in Lt side of her chest, especially when coughing.   Physical Exam:   Appearance - well kempt   ENMT - no sinus tenderness, no nasal discharge, no oral exudate  Neck - no masses, trachea midline, no thyromegaly, no elevation in JVP  Respiratory - normal appearance of chest wall, normal respiratory effort w/o accessory muscle use, no dullness on percussion, no wheezing or rales  CV - s1s2 regular rate and rhythm, no murmurs, no peripheral edema, radial pulses symmetric  GI - soft, non tender  Lymph - no adenopathy noted in neck and axillary areas  MSK - normal gait  Ext - no cyanosis, clubbing, or joint inflammation noted  Skin - no rashes, lesions, or ulcers  Neuro - normal strength, oriented x 3  Psych - normal mood and affect  Discussion:  She has persistent cough and dyspnea.  She has not improved as would be expected from trial of asthma therapy.  Her chest xray from January 2020 showed new area  of atelectasis.  Need to have further imaging studies to determine if there is alternative cause to her symptoms besides asthma.  Assessment/Plan:   Dyspnea on exertion with cough concerning for persistent asthma. - leg cramps likely from symbicort >> will d/c this - continue singulair for now - will arrange for HRCT chest to further assess cause of dyspnea and cough  Left sided chest pain with history of left sided pneumonia, rib fracture, and lipoma removal. - will assess with CT chest   Patient Instructions  Stop using symbicort  Continue singulair 10 mg pill nightly  Will schedule high resolution CT chest  Follow up in 8 weeks    Chesley Mires, MD [facility] Pager: [phone] 01/14/2019, 2:00 PM  Flow Sheet     Pulmonary tests:  Spirometry 07/07/08 >> FEV1 2.29 (118%), FEV1% 80, borderline BD from FEF 25-75%  Spirometry 10/16/13 >> FEV1 2.08 (107%), FEV1% 81 FeNO 11/26/18 >> 24 RAST 11/26/18 >> dust mites PFT 12/17/18 >> FEV1 1.91 (104%), FEV1% 84, TLC 4.59 (105%), DLCO 93%  Chest imaging:  CT angio chest 09/01/15 >> 5 mm calcified granuloma RUL, mild scar RML and lingula, subtle mosaic attenuation  Cardiac tests:  Lt heart cath 07/16/13 >> Proximal LAD and RCA calcification, no coronary obstruction, normal LV function   Medications:   Allergies as of 01/14/2019  Reactions   Codeine Sulfate Nausea And Vomiting   Demerol [meperidine] Nausea And Vomiting      Medication List       Accurate as of January 14, 2019  2:00 PM. Always use your most recent med list.        albuterol 108 (90 Base) MCG/ACT inhaler Commonly known as:  PROVENTIL HFA;VENTOLIN HFA Inhale every 6 (six) hours as needed into the lungs for wheezing or shortness of breath.   amLODipine 2.5 MG tablet Commonly known as:  NORVASC TAKE 1 TABLET BY MOUTH  DAILY   betamethasone dipropionate 0.05 % cream Commonly known as:  DIPROLENE Apply 0.05 % topically as needed.    calcitRIOL 0.5 MCG capsule Commonly known as:  ROCALTROL Take 1 mcg by mouth daily. Takes 2 daily   levothyroxine 50 MCG tablet Commonly known as:  SYNTHROID, LEVOTHROID Take 50 mcg by mouth daily before breakfast.   montelukast 10 MG tablet Commonly known as:  SINGULAIR Take 1 tablet (10 mg total) by mouth at bedtime.   multivitamin tablet Take 1 tablet by mouth daily.   pramipexole 0.25 MG tablet Commonly known as:  MIRAPEX Take 0.25 mg by mouth at bedtime and may repeat dose one time if needed.       Past Surgical History:  She  has a past surgical history that includes Thyroidectomy, partial; Parathyroidectomy; Lipoma excision; Cesarean section; Tonsillectomy; Carpal tunnel release (Bilateral); Trigger finger release (Left, 03/20/2016); Breast excisional biopsy (Left, 2000); and Trigger finger release (Right, 10/01/2017).  Family History:  Her family history includes Cancer in her brother and father; Emphysema in her mother; Heart attack in her brother; Heart disease (age of onset: 64) in her brother; Hyperlipidemia in her father; Liver disease in her father; Stroke in her sister.  Social History:  She  reports that she has never smoked. She has never used smokeless tobacco. She reports current alcohol use. She reports that she does not use drugs.

## 2019-01-19 ENCOUNTER — Ambulatory Visit (INDEPENDENT_AMBULATORY_CARE_PROVIDER_SITE_OTHER)
Admission: RE | Admit: 2019-01-19 | Discharge: 2019-01-19 | Disposition: A | Discharge status: Home / Self Care | Payer: Medicare Other | Source: Ambulatory Visit | Attending: Pulmonary Disease | Admitting: Pulmonary Disease

## 2019-01-19 ENCOUNTER — Other Ambulatory Visit: Payer: Self-pay

## 2019-01-19 DIAGNOSIS — J479 Bronchiectasis, uncomplicated: Secondary | ICD-10-CM | POA: Diagnosis not present

## 2019-01-19 DIAGNOSIS — R05 Cough: Secondary | ICD-10-CM | POA: Diagnosis not present

## 2019-01-19 DIAGNOSIS — R0600 Dyspnea, unspecified: Secondary | ICD-10-CM

## 2019-01-19 DIAGNOSIS — R053 Chronic cough: Secondary | ICD-10-CM

## 2019-01-19 DIAGNOSIS — R0609 Other forms of dyspnea: Secondary | ICD-10-CM

## 2019-01-22 NOTE — Telephone Encounter (Signed)
VS please advise on patients e-mail above. Thank you.

## 2019-02-02 DIAGNOSIS — L738 Other specified follicular disorders: Secondary | ICD-10-CM | POA: Diagnosis not present

## 2019-02-02 DIAGNOSIS — L245 Irritant contact dermatitis due to other chemical products: Secondary | ICD-10-CM | POA: Diagnosis not present

## 2019-02-02 DIAGNOSIS — L218 Other seborrheic dermatitis: Secondary | ICD-10-CM | POA: Diagnosis not present

## 2019-02-27 ENCOUNTER — Ambulatory Visit: Payer: Medicare Other

## 2019-03-11 ENCOUNTER — Ambulatory Visit: Payer: Medicare Other | Admitting: Pulmonary Disease

## 2019-03-11 ENCOUNTER — Emergency Department (HOSPITAL_COMMUNITY): Payer: Medicare Other

## 2019-03-11 ENCOUNTER — Other Ambulatory Visit: Payer: Self-pay

## 2019-03-11 ENCOUNTER — Emergency Department (HOSPITAL_COMMUNITY)
Admission: EM | Admit: 2019-03-11 | Discharge: 2019-03-11 | Disposition: A | Discharge status: Home / Self Care | Payer: Medicare Other | Attending: Emergency Medicine | Admitting: Emergency Medicine

## 2019-03-11 ENCOUNTER — Ambulatory Visit: Payer: Medicare Other | Admitting: Nurse Practitioner

## 2019-03-11 ENCOUNTER — Encounter (HOSPITAL_COMMUNITY): Payer: Self-pay

## 2019-03-11 DIAGNOSIS — E200 Idiopathic hypoparathyroidism: Secondary | ICD-10-CM | POA: Diagnosis not present

## 2019-03-11 DIAGNOSIS — I10 Essential (primary) hypertension: Secondary | ICD-10-CM | POA: Diagnosis not present

## 2019-03-11 DIAGNOSIS — J45909 Unspecified asthma, uncomplicated: Secondary | ICD-10-CM | POA: Insufficient documentation

## 2019-03-11 DIAGNOSIS — N281 Cyst of kidney, acquired: Secondary | ICD-10-CM | POA: Diagnosis not present

## 2019-03-11 DIAGNOSIS — Z79899 Other long term (current) drug therapy: Secondary | ICD-10-CM | POA: Insufficient documentation

## 2019-03-11 DIAGNOSIS — K529 Noninfective gastroenteritis and colitis, unspecified: Secondary | ICD-10-CM

## 2019-03-11 DIAGNOSIS — R6883 Chills (without fever): Secondary | ICD-10-CM | POA: Diagnosis present

## 2019-03-11 DIAGNOSIS — G2581 Restless legs syndrome: Secondary | ICD-10-CM | POA: Diagnosis not present

## 2019-03-11 DIAGNOSIS — A09 Infectious gastroenteritis and colitis, unspecified: Secondary | ICD-10-CM | POA: Diagnosis not present

## 2019-03-11 DIAGNOSIS — R109 Unspecified abdominal pain: Secondary | ICD-10-CM | POA: Diagnosis not present

## 2019-03-11 DIAGNOSIS — Z20828 Contact with and (suspected) exposure to other viral communicable diseases: Secondary | ICD-10-CM | POA: Diagnosis not present

## 2019-03-11 DIAGNOSIS — Z1159 Encounter for screening for other viral diseases: Secondary | ICD-10-CM | POA: Insufficient documentation

## 2019-03-11 DIAGNOSIS — J449 Chronic obstructive pulmonary disease, unspecified: Secondary | ICD-10-CM | POA: Diagnosis not present

## 2019-03-11 DIAGNOSIS — E039 Hypothyroidism, unspecified: Secondary | ICD-10-CM | POA: Diagnosis not present

## 2019-03-11 DIAGNOSIS — R197 Diarrhea, unspecified: Secondary | ICD-10-CM | POA: Diagnosis not present

## 2019-03-11 DIAGNOSIS — E7800 Pure hypercholesterolemia, unspecified: Secondary | ICD-10-CM | POA: Diagnosis not present

## 2019-03-11 DIAGNOSIS — I2510 Atherosclerotic heart disease of native coronary artery without angina pectoris: Secondary | ICD-10-CM | POA: Diagnosis not present

## 2019-03-11 DIAGNOSIS — J309 Allergic rhinitis, unspecified: Secondary | ICD-10-CM | POA: Diagnosis not present

## 2019-03-11 LAB — CBC WITH DIFFERENTIAL/PLATELET
Abs Immature Granulocytes: 0.02 10*3/uL (ref 0.00–0.07)
Basophils Absolute: 0 10*3/uL (ref 0.0–0.1)
Basophils Relative: 0 %
Eosinophils Absolute: 0 10*3/uL (ref 0.0–0.5)
Eosinophils Relative: 0 %
HCT: 39.7 % (ref 36.0–46.0)
Hemoglobin: 13.3 g/dL (ref 12.0–15.0)
Immature Granulocytes: 0 %
Lymphocytes Relative: 5 %
Lymphs Abs: 0.4 10*3/uL — ABNORMAL LOW (ref 0.7–4.0)
MCH: 30.1 pg (ref 26.0–34.0)
MCHC: 33.5 g/dL (ref 30.0–36.0)
MCV: 89.8 fL (ref 80.0–100.0)
Monocytes Absolute: 0.3 10*3/uL (ref 0.1–1.0)
Monocytes Relative: 4 %
Neutro Abs: 7.9 10*3/uL — ABNORMAL HIGH (ref 1.7–7.7)
Neutrophils Relative %: 91 %
Platelets: 258 10*3/uL (ref 150–400)
RBC: 4.42 MIL/uL (ref 3.87–5.11)
RDW: 13.5 % (ref 11.5–15.5)
WBC: 8.7 10*3/uL (ref 4.0–10.5)
nRBC: 0 % (ref 0.0–0.2)

## 2019-03-11 LAB — URINALYSIS, ROUTINE W REFLEX MICROSCOPIC
Bilirubin Urine: NEGATIVE
Glucose, UA: NEGATIVE mg/dL
Hgb urine dipstick: NEGATIVE
Ketones, ur: NEGATIVE mg/dL
Leukocytes,Ua: NEGATIVE
Nitrite: NEGATIVE
Protein, ur: NEGATIVE mg/dL
Specific Gravity, Urine: 1.005 (ref 1.005–1.030)
pH: 7 (ref 5.0–8.0)

## 2019-03-11 LAB — COMPREHENSIVE METABOLIC PANEL
ALT: 23 U/L (ref 0–44)
AST: 28 U/L (ref 15–41)
Albumin: 4 g/dL (ref 3.5–5.0)
Alkaline Phosphatase: 69 U/L (ref 38–126)
Anion gap: 10 (ref 5–15)
BUN: 12 mg/dL (ref 8–23)
CO2: 23 mmol/L (ref 22–32)
Calcium: 8.8 mg/dL — ABNORMAL LOW (ref 8.9–10.3)
Chloride: 103 mmol/L (ref 98–111)
Creatinine, Ser: 0.83 mg/dL (ref 0.44–1.00)
GFR calc Af Amer: >60 mL/min (ref >60)
GFR calc non Af Amer: >60 mL/min (ref >60)
Glucose, Bld: 117 mg/dL — ABNORMAL HIGH (ref 70–99)
Potassium: 3.3 mmol/L — ABNORMAL LOW (ref 3.5–5.1)
Sodium: 136 mmol/L (ref 135–145)
Total Bilirubin: 0.5 mg/dL (ref 0.3–1.2)
Total Protein: 6.8 g/dL (ref 6.5–8.1)

## 2019-03-11 LAB — LIPASE, BLOOD: Lipase: 26 U/L (ref 11–51)

## 2019-03-11 MED ORDER — METRONIDAZOLE 500 MG PO TABS: 500.0000 mg | ORAL_TABLET | Freq: Three times a day (TID) | ORAL | 0 refills | Status: AC

## 2019-03-11 MED ORDER — IOHEXOL 300 MG/ML  SOLN
100.0000 mL | Freq: Once | INTRAMUSCULAR | Status: AC | PRN
  Administered 2019-03-11: 100 mL via INTRAVENOUS

## 2019-03-11 MED ORDER — LACTATED RINGERS IV BOLUS
1000.0000 mL | Freq: Once | INTRAVENOUS | Status: AC
  Administered 2019-03-11: 1000 mL via INTRAVENOUS

## 2019-03-11 MED ORDER — POTASSIUM CHLORIDE CRYS ER 20 MEQ PO TBCR
40.0000 meq | EXTENDED_RELEASE_TABLET | Freq: Once | ORAL | Status: AC
  Administered 2019-03-11: 40 meq via ORAL
  Filled 2019-03-11: qty 2

## 2019-03-11 MED ORDER — SODIUM CHLORIDE (PF) 0.9 % IJ SOLN
INTRAMUSCULAR | Status: AC
  Filled 2019-03-11: qty 50

## 2019-03-11 MED ORDER — CIPROFLOXACIN HCL 500 MG PO TABS: 500.0000 mg | ORAL_TABLET | Freq: Two times a day (BID) | ORAL | 0 refills | Status: AC

## 2019-03-11 NOTE — ED Triage Notes (Signed)
Patient is AOx4 and ambulatory. Patient arrived via POV from home. Patient chief complaint is Diarrhea and Chills that began 24 hours ago in morning. Patient has no other complaints.

## 2019-03-11 NOTE — ED Provider Notes (Signed)
[facility] Provider Note   CSN: [id number] Arrival date & time: 03/11/19  1019    History   Chief Complaint Chief Complaint  Patient presents with   Chills   Diarrhea    HPI [name] is a 73 y.o. female.     The history is provided by the patient.  Abdominal Pain  Pain location:  Generalized Pain quality: aching and dull   Pain radiates to:  Does not radiate Pain severity:  Mild Onset quality:  Gradual Duration:  2 days Timing:  Constant Progression:  Unchanged Chronicity:  New Context: suspicious food intake   Relieved by:  Nothing Worsened by:  Nothing Associated symptoms: diarrhea   Associated symptoms: no anorexia, no belching, no chest pain, no chills, no constipation, no cough, no dysuria, no fatigue, no fever, no hematemesis, no hematochezia, no hematuria, no melena, no nausea, no shortness of breath, no sore throat, no vaginal bleeding and no vomiting     Past Medical History:  Diagnosis Date   Abnormal stress test 07/16/2013   a. in 2014 had abnormal nuc which lead to a LHC 07/2013: moderate proximal LAD and RCA calcification without obstruction, widely patent cors, with systolic compression/bridging in the mid to distal LAD segment, normal EF.   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 Rate related LBBB noted on nuc 09/2015.   Restless leg syndrome    Upper airway cough syndrome 12/10/2013    Patient Active Problem List   Diagnosis Date Noted   Abnormal nuclear stress test 03/22/2016   Acquired trigger finger 02/24/2016   LBBB (left bundle branch block)    Hyperlipidemia    Hypothyroidism    Upper airway cough syndrome 12/10/2013   Uncontrolled persistent asthma 10/16/2013   Exercise-induced asthma 10/16/2013   Chest discomfort 07/16/2013    Class: Acute     Past Surgical History:  Procedure Laterality Date   BREAST EXCISIONAL BIOPSY Left 2000   CARPAL TUNNEL RELEASE Bilateral    CESAREAN SECTION     x2   LIPOMA EXCISION     From back   PARATHYROIDECTOMY     Partial   THYROIDECTOMY, PARTIAL     TONSILLECTOMY     TRIGGER FINGER RELEASE Left 03/20/2016   Procedure: RELEASE A-1 PULLEY LEFT RING FINGER;  Surgeon: Daryll Brod, MD;  Location: [place] [facility];  Service: Orthopedics;  Laterality: Left;  ANESTHESIA: IV REGIONAL FAB   TRIGGER FINGER RELEASE Right 10/01/2017   Procedure: RELEASE TRIGGER FINGER/A-1 PULLEY RIGHT RING FINGER;  Surgeon: Daryll Brod, MD;  Location: [place];  Service: Orthopedics;  Laterality: Right;     OB History   No obstetric history on file.      Home Medications    Prior to Admission medications   Medication Sig Start Date End Date Taking? Authorizing Provider  acetaminophen (TYLENOL) 325 MG tablet Take 325-650 mg by mouth every 6 (six) hours as needed for mild pain or headache.   Yes [provider]  amLODipine (NORVASC) 2.5 MG tablet TAKE 1 TABLET BY MOUTH  DAILY Patient taking differently: Take 2.5 mg by mouth daily.  08/06/16  Yes Belva Crome, MD  calcitRIOL (ROCALTROL) 0.5 MCG capsule Take 1 mcg by mouth daily.    Yes [provider]  levothyroxine (  SYNTHROID, LEVOTHROID) 50 MCG tablet Take 50 mcg by mouth daily before breakfast.   Yes [provider]  montelukast (SINGULAIR) 10 MG tablet Take 1 tablet (10 mg total) by mouth at bedtime. 12/17/18  Yes Chesley Mires, MD  Multiple Vitamin (MULTIVITAMIN) tablet Take 1 tablet by mouth daily.   Yes [provider]  pramipexole (MIRAPEX) 0.25 MG tablet Take 0.25 mg by mouth at bedtime and may repeat dose one time if needed.  08/31/16  Yes [provider]  ciprofloxacin (CIPRO) 500 MG tablet Take 1 tablet (500 mg total) by mouth every 12 (twelve) hours for 10 days. 03/11/19 03/21/19   Mazella Deen, DO  metroNIDAZOLE (FLAGYL) 500 MG tablet Take 1 tablet (500 mg total) by mouth 3 (three) times daily for 10 days. 03/11/19 03/21/19  Lennice Sites, DO    Family History Family History  Problem Relation Age of Onset   Emphysema Mother    Hyperlipidemia Father    Liver disease Father    Cancer Father        SKIN   Stroke Sister    Heart disease Brother 80       stents   Cancer Brother        SKIN, TESTICULAR   Heart attack Brother    Hypertension Neg Hx    Breast cancer Neg Hx     Social History Social History   Tobacco Use   Smoking status: Never Smoker   Smokeless tobacco: Never Used  Substance Use Topics   Alcohol use: Yes    Alcohol/week: 0.0 standard drinks    Comment: social   Drug use: No     Allergies   Codeine sulfate and Demerol [meperidine]   Review of Systems Review of Systems  Constitutional: Negative for chills, fatigue and fever.  HENT: Negative for ear pain and sore throat.   Eyes: Negative for pain and visual disturbance.  Respiratory: Negative for cough and shortness of breath.   Cardiovascular: Negative for chest pain and palpitations.  Gastrointestinal: Positive for abdominal pain and diarrhea. Negative for abdominal distention, anorexia, constipation, hematemesis, hematochezia, melena, nausea and vomiting.  Genitourinary: Negative for dysuria, hematuria and vaginal bleeding.  Musculoskeletal: Negative for arthralgias and back pain.  Skin: Negative for color change and rash.  Neurological: Negative for seizures and syncope.  All other systems reviewed and are negative.    Physical Exam Updated Vital Signs  ED Triage Vitals  Enc Vitals Group     BP 03/11/19 1040 (!) 144/95     Pulse Rate 03/11/19 1040 97     Resp 03/11/19 1040 16     Temp 03/11/19 1040 99.8 F (37.7 C)     Temp Source 03/11/19 1040 Oral     SpO2 03/11/19 1040 97 %     Weight 03/11/19 1044 120 lb (54.4 kg)     Height 03/11/19 1044 4'  11.5" (1.511 m)     Head Circumference --      Peak Flow --      Pain Score 03/11/19 1044 0     Pain Loc --      Pain Edu? --      Excl. in [place]? --     Physical Exam Vitals signs and nursing note reviewed.  Constitutional:      General: She is not in acute distress.    Appearance: She is well-developed. She is not ill-appearing.  HENT:     Head: Normocephalic and atraumatic.     Nose:  Nose normal.     Mouth/Throat:     Mouth: Mucous membranes are dry.  Eyes:     Extraocular Movements: Extraocular movements intact.     Conjunctiva/sclera: Conjunctivae normal.     Pupils: Pupils are equal, round, and reactive to light.  Neck:     Musculoskeletal: Normal range of motion and neck supple.  Cardiovascular:     Rate and Rhythm: Normal rate and regular rhythm.     Heart sounds: No murmur.  Pulmonary:     Effort: Pulmonary effort is normal. No respiratory distress.     Breath sounds: Normal breath sounds.  Abdominal:     General: Abdomen is flat. There is no distension.     Palpations: Abdomen is soft.     Tenderness: There is abdominal tenderness (LLQ/LUQ). There is no right CVA tenderness, left CVA tenderness, guarding or rebound.  Skin:    General: Skin is warm and dry.     Capillary Refill: Capillary refill takes less than 2 seconds.  Neurological:     General: No focal deficit present.     Mental Status: She is alert.      ED Treatments / Results  Labs (all labs ordered are listed, but only abnormal results are displayed) Labs Reviewed  COMPREHENSIVE METABOLIC PANEL - Abnormal; Notable for the following components:      Result Value   Potassium 3.3 (*)    Glucose, Bld 117 (*)    Calcium 8.8 (*)    All other components within normal limits  CBC WITH DIFFERENTIAL/PLATELET - Abnormal; Notable for the following components:   Neutro Abs 7.9 (*)    Lymphs Abs 0.4 (*)    All other components within normal limits  NOVEL CORONAVIRUS, NAA (HOSPITAL ORDER, SEND-OUT TO REF  LAB)  LIPASE, BLOOD  URINALYSIS, ROUTINE W REFLEX MICROSCOPIC    EKG None  Radiology Ct Abdomen Pelvis W Contrast  Result Date: 03/11/2019 CLINICAL DATA:  73 year old female with diarrhea and chills EXAM: CT ABDOMEN AND PELVIS WITH CONTRAST TECHNIQUE: Multidetector CT imaging of the abdomen and pelvis was performed using the standard protocol following bolus administration of intravenous contrast. CONTRAST:  165mL OMNIPAQUE IOHEXOL 300 MG/ML  SOLN COMPARISON:  Prior CT scan of the pelvis 03/22/2014 FINDINGS: Lower chest: The lung bases are clear. Visualized cardiac structures are within normal limits for size. No pericardial effusion. Unremarkable visualized distal thoracic esophagus. Hepatobiliary: Normal hepatic contour and morphology. No discrete hepatic lesions. Normal appearance of the gallbladder. No intra or extrahepatic biliary ductal dilatation. Pancreas: Unremarkable. No pancreatic ductal dilatation or surrounding inflammatory changes. Spleen: Normal in size without focal abnormality. Adrenals/Urinary Tract: The adrenal glands are normal. Tiny hypoechoic renal lesions are present bilaterally. While too small for accurate characterization, these lesions are statistically highly likely benign cysts. Stomach/Bowel: Diffuse cobblestone like submucosal edema beginning at the hepatic flexure and extending through the transverse colon to the splenic flexure. The remainder of the colon appears uninvolved. The terminal ileum is normal. No evidence of obstruction. Vascular/Lymphatic: Atherosclerotic calcifications present along the abdominal aorta. No evidence of aneurysm or significant stenosis. No suspicious lymphadenopathy. Reproductive: Uterus and bilateral adnexa are unremarkable. Other: No abdominal wall hernia or abnormality. No abdominopelvic ascites. Musculoskeletal: No acute or significant osseous findings. IMPRESSION: 1. Cobblestone like submucosal thickening present throughout the transverse  colon concerning for active infectious or inflammatory colitis. No evidence of perforation or abscess formation. 2.  Aortic Atherosclerosis (ICD10-170.0). 3. Small probable bilateral simple renal cysts. Electronically Signed  By: Jacqulynn Cadet M.D.   On: 03/11/2019 12:08    Procedures Procedures (including critical care time)  Medications Ordered in ED Medications  sodium chloride (PF) 0.9 % injection (has no administration in time range)  lactated ringers bolus 1,000 mL (1,000 mLs Intravenous New Bag/Given 03/11/19 1100)  iohexol (OMNIPAQUE) 300 MG/ML solution 100 mL (100 mLs Intravenous Contrast Given 03/11/19 1145)     Initial Impression / Assessment and Plan / ED Course  I have reviewed the triage vital signs and the nursing notes.  Pertinent labs & imaging results that were available during my care of the patient were reviewed by me and considered in my medical decision making (see chart for details).     ZANYIA SILBAUGH is a 73 year old female with history of hypertension who presents to the ED with diarrhea, abdominal pain.  Patient with overall unremarkable vitals.  No fever.  Symptoms started yesterday with left-sided abdominal pain and diarrhea.  Possible suspicious food intake.  No respiratory symptoms, no chest pain.  No known coronavirus exposures but did have people of her house several days ago.  Patient denies any blood in her stool or dark tarry stools.  No nausea, no vomiting.  Has been able to tolerate fluids.  Patient was sent from primary care doctor due to concern for dehydration.  Patient does look clinically dry on exam.  She has left-sided tenderness on exam of her abdomen.  No peritonitis.  Will evaluate with lab work including CT abdomen and pelvis.  Will give IV fluids.  Suspect possible gastroenteritis, diverticulitis, foodborne pathogen.  Will evaluate for electrolyte abnormalities and acute kidney injury.  Patient did not endorse any urinary symptoms.  May  consider coronavirus testing as well.  Urinalysis shows no signs of infection.  No significant electrolyte abnormality, kidney injury, leukocytosis, anemia.  Patient with CT scan of the abdomen and pelvis that showed infectious versus inflammatory colitis of the transverse colon.  No abscess or perforation.  Patient felt better after IV fluids.  Send out coronavirus testing was ordered given GI symptoms and fever.  Overall atypical for coronavirus, but GI symptoms have been associated with coronavirus.  She was given education about self isolation.  Given return precautions.  No respiratory symptoms throughout my care.  Patient overall well-appearing.  No signs of sepsis.  Able to tolerate p.o.  Understands return precautions.  This chart was dictated using voice recognition software.  Despite best efforts to proofread,  errors can occur which can change the documentation meaning.   [name] was evaluated in Emergency Department on 03/11/2019 for the symptoms described in the history of present illness. She was evaluated in the context of the global COVID-19 pandemic, which necessitated consideration that the patient might be at risk for infection with the SARS-CoV-2 virus that causes COVID-19. Institutional protocols and algorithms that pertain to the evaluation of patients at risk for COVID-19 are in a state of rapid change based on information released by regulatory bodies including the CDC and federal and state organizations. These policies and algorithms were followed during the patient's care in the ED.   Final Clinical Impressions(s) / ED Diagnoses   Final diagnoses:  Colitis    ED Discharge Orders         Ordered    metroNIDAZOLE (FLAGYL) 500 MG tablet  3 times daily     03/11/19 1226    ciprofloxacin (CIPRO) 500 MG tablet  Every 12 hours     03/11/19 1226  Lennice Sites, DO 03/11/19 1228

## 2019-03-12 LAB — NOVEL CORONAVIRUS, NAA (HOSP ORDER, SEND-OUT TO REF LAB; TAT 18-24 HRS): SARS-CoV-2, NAA: NOT DETECTED

## 2019-03-13 DIAGNOSIS — R197 Diarrhea, unspecified: Secondary | ICD-10-CM | POA: Diagnosis not present

## 2019-03-13 DIAGNOSIS — K529 Noninfective gastroenteritis and colitis, unspecified: Secondary | ICD-10-CM | POA: Diagnosis not present

## 2019-03-24 DIAGNOSIS — Z800 Family history of malignant neoplasm of digestive organs: Secondary | ICD-10-CM | POA: Diagnosis not present

## 2019-03-24 DIAGNOSIS — R933 Abnormal findings on diagnostic imaging of other parts of digestive tract: Secondary | ICD-10-CM | POA: Diagnosis not present

## 2019-03-24 DIAGNOSIS — Z86010 Personal history of colonic polyps: Secondary | ICD-10-CM | POA: Diagnosis not present

## 2019-03-24 DIAGNOSIS — K529 Noninfective gastroenteritis and colitis, unspecified: Secondary | ICD-10-CM | POA: Diagnosis not present

## 2019-04-02 DIAGNOSIS — R197 Diarrhea, unspecified: Secondary | ICD-10-CM | POA: Diagnosis not present

## 2019-04-02 DIAGNOSIS — K5730 Diverticulosis of large intestine without perforation or abscess without bleeding: Secondary | ICD-10-CM | POA: Diagnosis not present

## 2019-04-02 DIAGNOSIS — Z800 Family history of malignant neoplasm of digestive organs: Secondary | ICD-10-CM | POA: Diagnosis not present

## 2019-04-02 DIAGNOSIS — Z86010 Personal history of colonic polyps: Secondary | ICD-10-CM | POA: Diagnosis not present

## 2019-04-06 ENCOUNTER — Telehealth: Payer: Self-pay | Admitting: Pulmonary Disease

## 2019-04-06 MED ORDER — MONTELUKAST SODIUM 10 MG PO TABS: 10.0000 mg | ORAL_TABLET | Freq: Every day | ORAL | 1 refills | Status: DC

## 2019-04-06 NOTE — Telephone Encounter (Signed)
90-day supply of pt's Singulair Rx has been sent to preferred pharmacy. Called and spoke with pt letting her know this had been done and pt verbalized understanding. Nothing further needed.

## 2019-05-05 DIAGNOSIS — N958 Other specified menopausal and perimenopausal disorders: Secondary | ICD-10-CM | POA: Diagnosis not present

## 2019-05-05 DIAGNOSIS — M816 Localized osteoporosis [Lequesne]: Secondary | ICD-10-CM | POA: Diagnosis not present

## 2019-05-05 DIAGNOSIS — Z6824 Body mass index (BMI) 24.0-24.9, adult: Secondary | ICD-10-CM | POA: Diagnosis not present

## 2019-05-05 DIAGNOSIS — Z01419 Encounter for gynecological examination (general) (routine) without abnormal findings: Secondary | ICD-10-CM | POA: Diagnosis not present

## 2019-06-02 ENCOUNTER — Other Ambulatory Visit: Payer: Self-pay

## 2019-06-02 ENCOUNTER — Ambulatory Visit
Admission: RE | Admit: 2019-06-02 | Discharge: 2019-06-02 | Disposition: A | Discharge status: Home / Self Care | Payer: Medicare Other | Source: Ambulatory Visit | Attending: Family Medicine | Admitting: Family Medicine

## 2019-06-02 DIAGNOSIS — Z1231 Encounter for screening mammogram for malignant neoplasm of breast: Secondary | ICD-10-CM | POA: Diagnosis not present

## 2019-06-03 DIAGNOSIS — G2581 Restless legs syndrome: Secondary | ICD-10-CM | POA: Diagnosis not present

## 2019-06-17 DIAGNOSIS — H2513 Age-related nuclear cataract, bilateral: Secondary | ICD-10-CM | POA: Diagnosis not present

## 2019-06-17 DIAGNOSIS — H43813 Vitreous degeneration, bilateral: Secondary | ICD-10-CM | POA: Diagnosis not present

## 2019-06-24 DIAGNOSIS — Z23 Encounter for immunization: Secondary | ICD-10-CM | POA: Diagnosis not present

## 2019-07-31 ENCOUNTER — Other Ambulatory Visit: Payer: Self-pay | Admitting: Pulmonary Disease

## 2019-09-15 DIAGNOSIS — I10 Essential (primary) hypertension: Secondary | ICD-10-CM | POA: Diagnosis not present

## 2019-09-15 DIAGNOSIS — J309 Allergic rhinitis, unspecified: Secondary | ICD-10-CM | POA: Diagnosis not present

## 2019-09-15 DIAGNOSIS — E7800 Pure hypercholesterolemia, unspecified: Secondary | ICD-10-CM | POA: Diagnosis not present

## 2019-09-15 DIAGNOSIS — G2581 Restless legs syndrome: Secondary | ICD-10-CM | POA: Diagnosis not present

## 2019-09-15 DIAGNOSIS — E200 Idiopathic hypoparathyroidism: Secondary | ICD-10-CM | POA: Diagnosis not present

## 2019-09-15 DIAGNOSIS — E039 Hypothyroidism, unspecified: Secondary | ICD-10-CM | POA: Diagnosis not present

## 2019-09-15 DIAGNOSIS — Z0000 Encounter for general adult medical examination without abnormal findings: Secondary | ICD-10-CM | POA: Diagnosis not present

## 2019-09-15 DIAGNOSIS — M810 Age-related osteoporosis without current pathological fracture: Secondary | ICD-10-CM | POA: Diagnosis not present

## 2019-09-15 DIAGNOSIS — J449 Chronic obstructive pulmonary disease, unspecified: Secondary | ICD-10-CM | POA: Diagnosis not present

## 2019-09-18 DIAGNOSIS — I10 Essential (primary) hypertension: Secondary | ICD-10-CM | POA: Diagnosis not present

## 2019-09-18 DIAGNOSIS — M810 Age-related osteoporosis without current pathological fracture: Secondary | ICD-10-CM | POA: Diagnosis not present

## 2019-09-18 DIAGNOSIS — E039 Hypothyroidism, unspecified: Secondary | ICD-10-CM | POA: Diagnosis not present

## 2019-09-18 DIAGNOSIS — E7800 Pure hypercholesterolemia, unspecified: Secondary | ICD-10-CM | POA: Diagnosis not present

## 2019-09-22 DIAGNOSIS — S61210A Laceration without foreign body of right index finger without damage to nail, initial encounter: Secondary | ICD-10-CM | POA: Diagnosis not present

## 2019-09-22 DIAGNOSIS — Z23 Encounter for immunization: Secondary | ICD-10-CM | POA: Diagnosis not present

## 2019-10-14 DIAGNOSIS — L308 Other specified dermatitis: Secondary | ICD-10-CM | POA: Diagnosis not present

## 2019-10-14 DIAGNOSIS — K130 Diseases of lips: Secondary | ICD-10-CM | POA: Diagnosis not present

## 2019-10-14 DIAGNOSIS — L570 Actinic keratosis: Secondary | ICD-10-CM | POA: Diagnosis not present

## 2019-11-02 IMAGING — US US EXTREM UP*L* LTD
1 series · 12 of 12 positions shown · non-contrast
Comparison: None.

CLINICAL DATA: Prior trigger finger release of the right ring
finger in September 2017, with persistent swelling at the surgical
site in new stiffness of the adjacent little finger.

EXAM:
ULTRASOUND LEFT UPPER EXTREMITY LIMITED
TECHNIQUE: Ultrasound examination of the upper extremity soft tissues was
performed in the area of clinical concern.

[Series 1: us extrem up*left* ltd · 12 acquisitions, 12 frames shown]
[im 1/12]
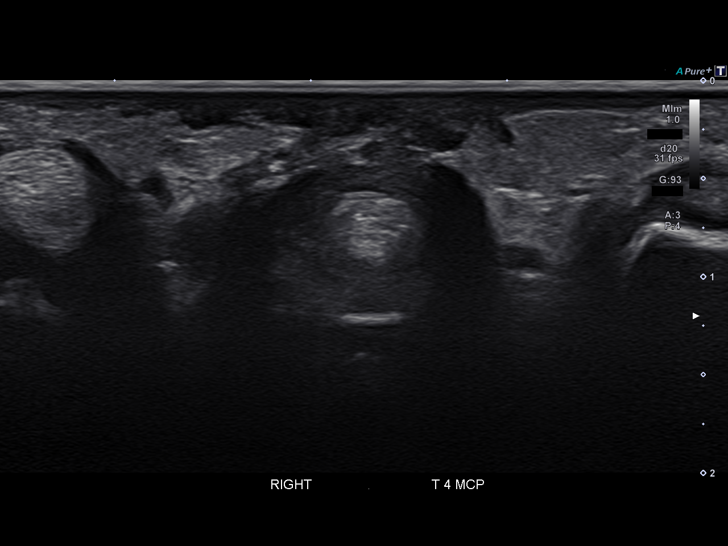
[im 2/12]
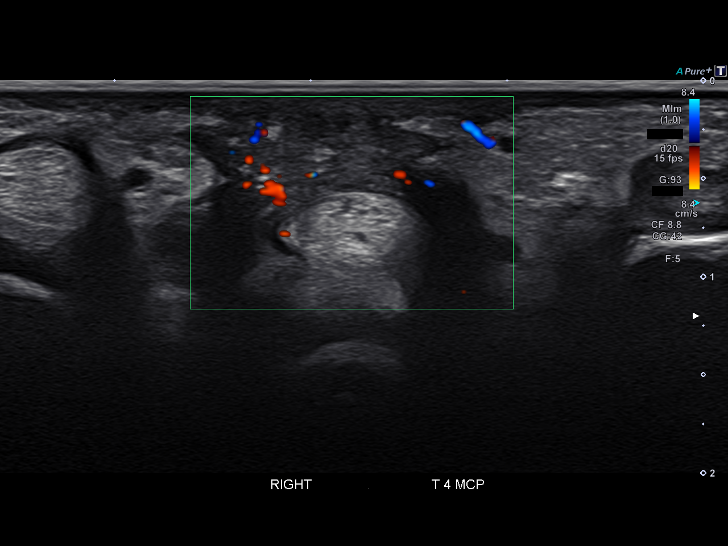
[im 3/12]
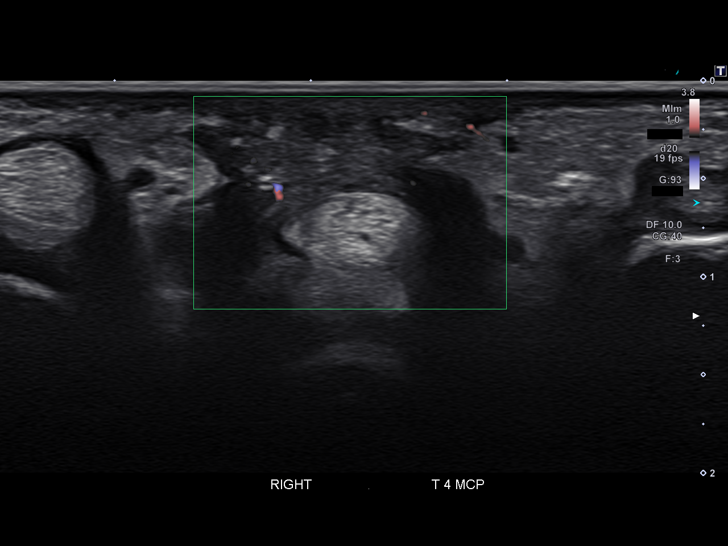
[im 4/12]
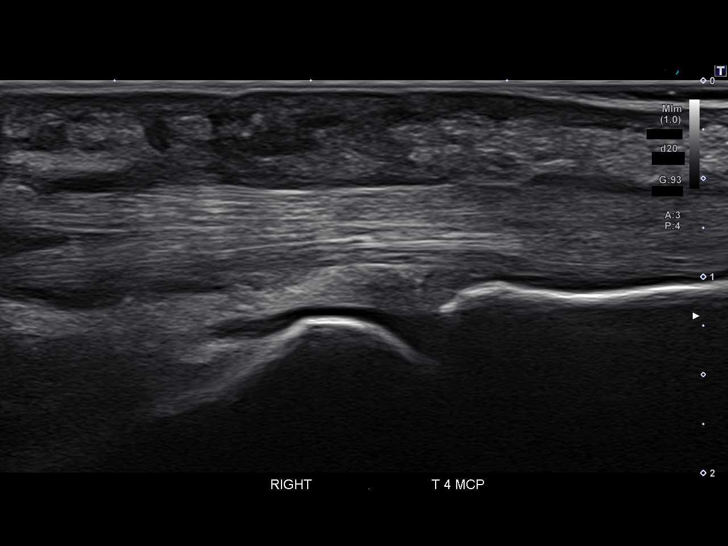
[im 5/12]
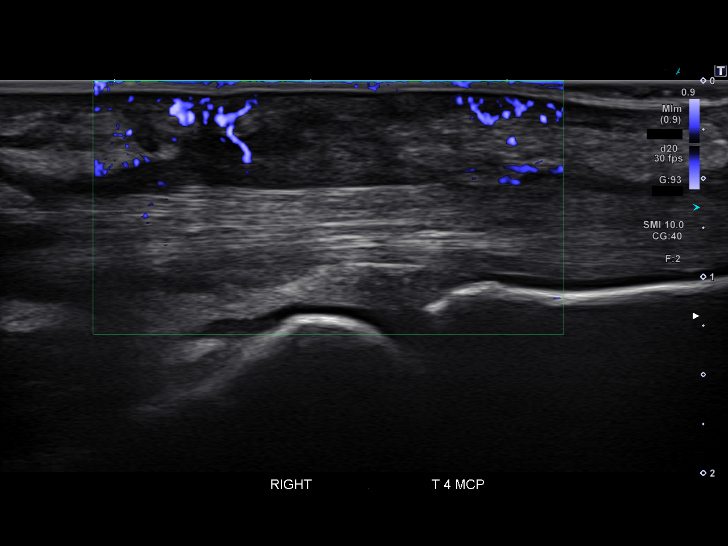
[im 6/12]
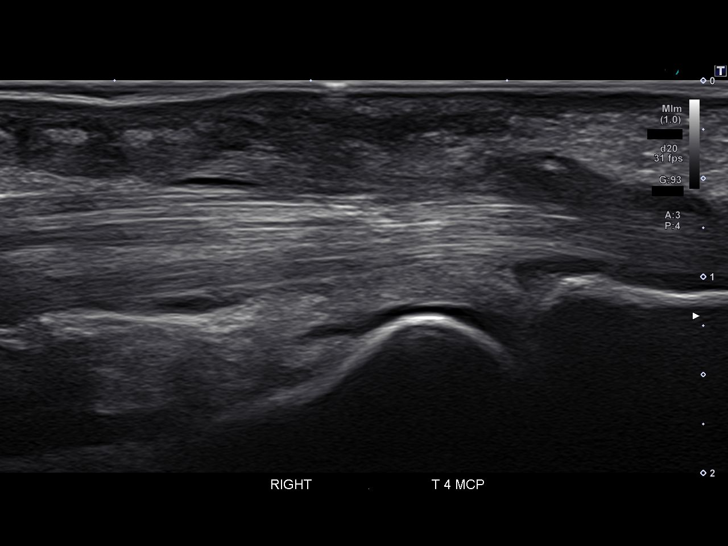
[im 7/12]
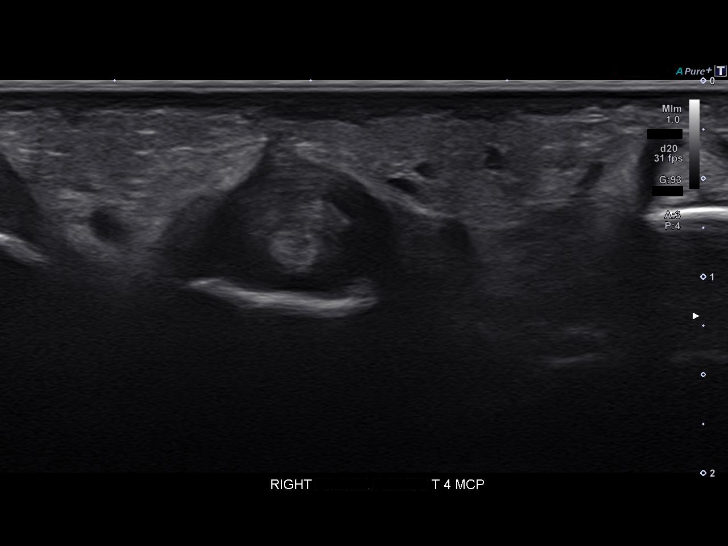
[im 8/12]
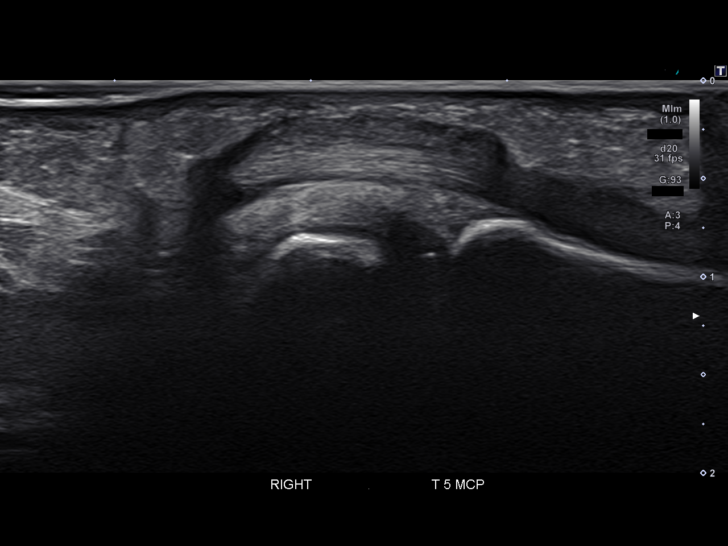
[im 9/12]
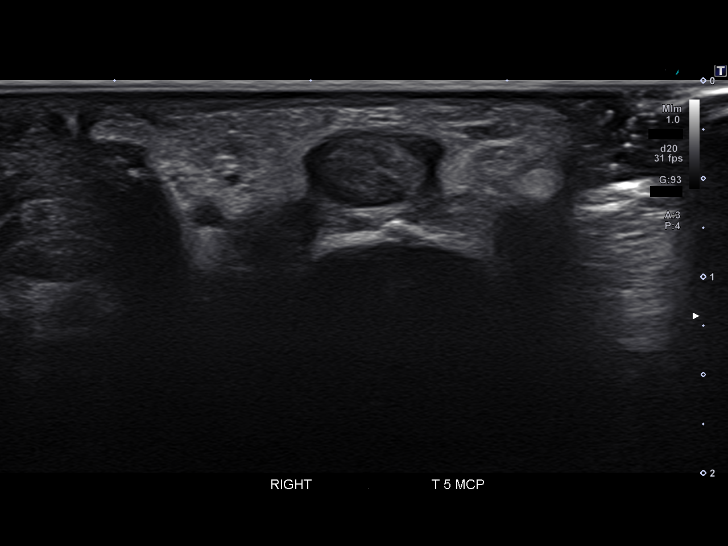
[im 10/12]
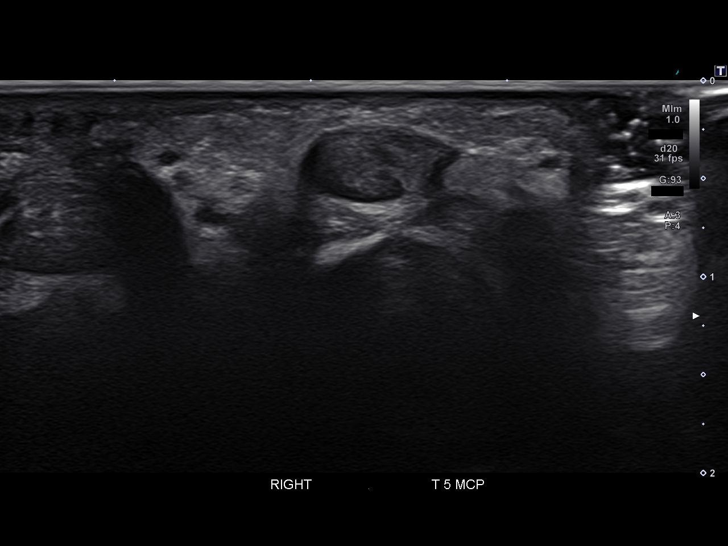
[im 11/12]
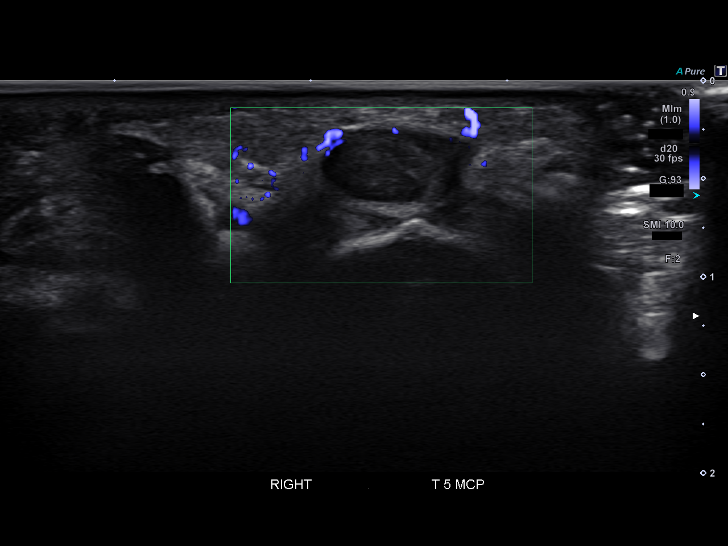
[im 12/12]
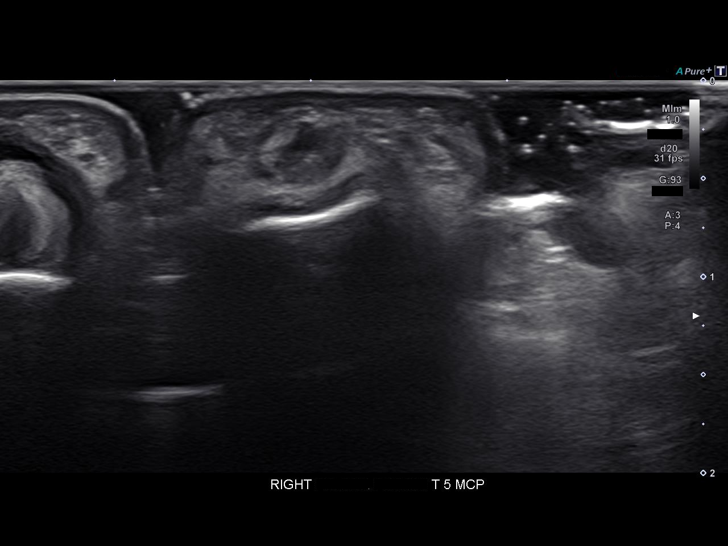

[12 of 12 positions shown; findings below may reference images not displayed]

FINDINGS: Ultrasound at the base of the right ring finger at the level of the
metacarpal joint demonstrates postsurgical changes related to
trigger finger release, with hypoechoic, somewhat hyperemic tissue
at the surgical site, likely a combination of granulation tissue and
scar. There is no recurrent cyst or other fluid collection. There is
a small amount of fluid within the flexor tendon sheath. The flexor
tendons are unremarkable in appearance.

Ultrasound at the base of the right little finger at the level of
the metacarpal joint demonstrates thickening of the A1 pulley, most
prominent along its ulnar aspect. There is minimal surrounding
hyperemia. The flexor tendons are intact.
IMPRESSION: 1. Postsurgical changes at the base of the right ring finger from
prior trigger finger release with granulation tissue/scar at the
surgical site. No recurrent cyst or other fluid collection.
2. Thickening of the right little finger A1 pulley, suggestive of
stenosing tenosynovitis..

## 2019-11-25 ENCOUNTER — Ambulatory Visit: Payer: Medicare Other | Attending: Nurse Practitioner

## 2019-11-25 DIAGNOSIS — Z23 Encounter for immunization: Secondary | ICD-10-CM

## 2019-11-25 DIAGNOSIS — J449 Chronic obstructive pulmonary disease, unspecified: Secondary | ICD-10-CM | POA: Diagnosis not present

## 2019-11-25 DIAGNOSIS — I2584 Coronary atherosclerosis due to calcified coronary lesion: Secondary | ICD-10-CM | POA: Diagnosis not present

## 2019-11-25 DIAGNOSIS — E039 Hypothyroidism, unspecified: Secondary | ICD-10-CM | POA: Diagnosis not present

## 2019-11-25 DIAGNOSIS — E7800 Pure hypercholesterolemia, unspecified: Secondary | ICD-10-CM | POA: Diagnosis not present

## 2019-11-25 DIAGNOSIS — M810 Age-related osteoporosis without current pathological fracture: Secondary | ICD-10-CM | POA: Diagnosis not present

## 2019-11-25 DIAGNOSIS — I2510 Atherosclerotic heart disease of native coronary artery without angina pectoris: Secondary | ICD-10-CM | POA: Diagnosis not present

## 2019-11-25 DIAGNOSIS — I10 Essential (primary) hypertension: Secondary | ICD-10-CM | POA: Diagnosis not present

## 2019-11-30 DIAGNOSIS — R432 Parageusia: Secondary | ICD-10-CM | POA: Diagnosis not present

## 2019-11-30 DIAGNOSIS — E559 Vitamin D deficiency, unspecified: Secondary | ICD-10-CM | POA: Diagnosis not present

## 2019-12-09 DIAGNOSIS — G4489 Other headache syndrome: Secondary | ICD-10-CM | POA: Diagnosis not present

## 2019-12-09 DIAGNOSIS — J0100 Acute maxillary sinusitis, unspecified: Secondary | ICD-10-CM | POA: Diagnosis not present

## 2019-12-09 DIAGNOSIS — R432 Parageusia: Secondary | ICD-10-CM | POA: Diagnosis not present

## 2019-12-10 ENCOUNTER — Other Ambulatory Visit: Payer: Self-pay | Admitting: Family Medicine

## 2019-12-10 DIAGNOSIS — G4489 Other headache syndrome: Secondary | ICD-10-CM

## 2019-12-16 ENCOUNTER — Ambulatory Visit: Payer: Medicare Other | Attending: Internal Medicine

## 2019-12-16 DIAGNOSIS — Z23 Encounter for immunization: Secondary | ICD-10-CM

## 2019-12-16 NOTE — Progress Notes (Signed)
   Covid-19 Vaccination Clinic  Name:  Kristine Cooper    MRN: [id number] DOB: [birth year]  12/16/2019  [name] was observed post Covid-19 immunization for 15 minutes without incidence. She was provided with Vaccine Information Sheet and instruction to access the V-Safe system.   [name] was instructed to call 911 with any severe reactions post vaccine: [name] Difficulty breathing  . Swelling of your face and throat  . A fast heartbeat  . A bad rash all over your body  . Dizziness and weakness    Immunizations Administered    Name Date Dose VIS Date Route   Pfizer COVID-19 Vaccine 12/16/2019  1:24 PM 0.3 mL 10/16/2019 Intramuscular   Manufacturer: [place]   Lot: [id number]   [place]: [id number]

## 2019-12-17 DIAGNOSIS — R432 Parageusia: Secondary | ICD-10-CM | POA: Diagnosis not present

## 2019-12-19 ENCOUNTER — Other Ambulatory Visit: Payer: Medicare Other

## 2019-12-24 ENCOUNTER — Other Ambulatory Visit: Payer: Medicare Other

## 2019-12-30 ENCOUNTER — Ambulatory Visit
Admission: RE | Admit: 2019-12-30 | Discharge: 2019-12-30 | Disposition: A | Discharge status: Home / Self Care | Payer: Medicare Other | Source: Ambulatory Visit | Attending: Family Medicine | Admitting: Family Medicine

## 2019-12-30 ENCOUNTER — Other Ambulatory Visit: Payer: Self-pay

## 2019-12-30 DIAGNOSIS — G4489 Other headache syndrome: Secondary | ICD-10-CM

## 2019-12-30 DIAGNOSIS — R519 Headache, unspecified: Secondary | ICD-10-CM | POA: Diagnosis not present

## 2020-01-02 ENCOUNTER — Other Ambulatory Visit: Payer: Medicare Other

## 2020-01-05 DIAGNOSIS — R739 Hyperglycemia, unspecified: Secondary | ICD-10-CM | POA: Diagnosis not present

## 2020-01-12 DIAGNOSIS — L245 Irritant contact dermatitis due to other chemical products: Secondary | ICD-10-CM | POA: Diagnosis not present

## 2020-01-12 DIAGNOSIS — D1801 Hemangioma of skin and subcutaneous tissue: Secondary | ICD-10-CM | POA: Diagnosis not present

## 2020-01-12 DIAGNOSIS — K130 Diseases of lips: Secondary | ICD-10-CM | POA: Diagnosis not present

## 2020-01-12 DIAGNOSIS — L821 Other seborrheic keratosis: Secondary | ICD-10-CM | POA: Diagnosis not present

## 2020-02-03 ENCOUNTER — Encounter: Payer: Self-pay | Admitting: *Deleted

## 2020-02-05 ENCOUNTER — Encounter: Payer: Self-pay | Admitting: Neurology

## 2020-02-05 ENCOUNTER — Ambulatory Visit (INDEPENDENT_AMBULATORY_CARE_PROVIDER_SITE_OTHER): Payer: Medicare Other | Admitting: Neurology

## 2020-02-05 ENCOUNTER — Other Ambulatory Visit: Payer: Self-pay

## 2020-02-05 VITALS — BP 142/83 | HR 69 | Temp 97.4°F | Ht 59.5 in | Wt 122.0 lb

## 2020-02-05 DIAGNOSIS — M5481 Occipital neuralgia: Secondary | ICD-10-CM | POA: Insufficient documentation

## 2020-02-05 NOTE — Progress Notes (Signed)
PATIENT: Kristine Cooper DOB: August 27, 1946  Chief Complaint  Patient presents with  . New Patient (Initial Visit)    headaches, electrical sensation in the head for a year. metallic taste since January. She had an MRI. Electrical shock can happen when she's lying bed resting. If she turns her head a certain way she might get a sensation. It's at the top of her head in two different places.   . Referral    Shirline Frees, MD  . Room 4    here alone has mask on     Kristine Cooper is a 74 year old female, seen in request by her primary care physician Dr. Kenton Kingfisher, Gwyndolyn Saxon for evaluation of new onset headache, initial evaluation was on February 05, 2020  I have reviewed and summarized the referring note from the referring physician.  She has past medical history of hypertension, hypothyroidism, on supplement,  Since March 2020, she noticed intermittent shooting pain at her skull, mostly coming from the left neck, radiating up to the left occipital parietal region, transient, only few seconds, there was no prolonged pain, she denies any focal difficulties, no dysarthria, no visual loss, no limb muscle pain or weakness  Since January 2021, she also developed some metallic taste in her mouth,  She had extensive laboratory evaluation through her primary care physician at [facility], we reviewed the result, normal or negative CMP, CBC, TSH, heavy metal screen, zinc, ESR was 6, S34, folic acid, iron, slightly decreased vitamin D 28, mild elevated A1c 6.0  Personally reviewed MRI of the brain without contrast in February 2021, no acute abnormality,   REVIEW OF SYSTEMS: Full 14 system review of systems performed and notable only for as above All other review of systems were negative.  ALLERGIES: Allergies  Allergen Reactions  . Codeine Sulfate Nausea And Vomiting  . Demerol [Meperidine] Nausea And Vomiting  . Neosporin [Bacitracin-Polymyxin B] Rash    HOME MEDICATIONS: Current  Outpatient Medications  Medication Sig Dispense Refill  . acetaminophen (TYLENOL) 325 MG tablet Take 325-650 mg by mouth every 6 (six) hours as needed for mild pain or headache.    [name] amLODipine (NORVASC) 2.5 MG tablet TAKE 1 TABLET BY MOUTH  DAILY (Patient taking differently: Take 2.5 mg by mouth daily. ) 90 tablet 2  . calcitRIOL (ROCALTROL) 0.5 MCG capsule Take 2 mcg by mouth daily.     [name] levothyroxine (SYNTHROID, LEVOTHROID) 50 MCG tablet Take 50 mcg by mouth daily before breakfast.    . montelukast (SINGULAIR) 10 MG tablet TAKE 1 TABLET BY MOUTH AT  BEDTIME 90 tablet 1  . pramipexole (MIRAPEX) 0.25 MG tablet Take 0.125 mg by mouth at bedtime and may repeat dose one time if needed.      No current facility-administered medications for this visit.    PAST MEDICAL HISTORY: Past Medical History:  Diagnosis Date  . Abnormal stress test 07/16/2013   a. in 2014 had abnormal nuc which lead to a LHC 07/2013: moderate proximal LAD and RCA calcification without obstruction, widely patent cors, with systolic compression/bridging in the mid to distal LAD segment, normal EF.  [name] Altered taste   . Arthritis    hands  . Exercise-induced asthma 10/16/2013  . Hyperlipidemia   . Hypertension   . Hypothyroidism    Multinodular goiter  . LBBB (left bundle branch block)    a. Rate related LBBB noted on nuc 09/2015.  [name] Restless leg syndrome   . Upper airway cough syndrome  12/10/2013    PAST SURGICAL HISTORY: Past Surgical History:  Procedure Laterality Date  . BREAST EXCISIONAL BIOPSY Left 2000  . CARPAL TUNNEL RELEASE Bilateral   . CESAREAN SECTION     x2  . LIPOMA EXCISION     From back  . PARATHYROIDECTOMY     Partial  . THYROIDECTOMY, PARTIAL    . TONSILLECTOMY    . TRIGGER FINGER RELEASE Left 03/20/2016   Procedure: RELEASE A-1 PULLEY LEFT RING FINGER;  Surgeon: Daryll Brod, MD;  Location: [facility];  Service: Orthopedics;  Laterality: Left;  ANESTHESIA: IV REGIONAL FAB  .  TRIGGER FINGER RELEASE Right 10/01/2017   Procedure: RELEASE TRIGGER FINGER/A-1 PULLEY RIGHT RING FINGER;  Surgeon: Daryll Brod, MD;  Location: [facility];  Service: Orthopedics;  Laterality: Right;    FAMILY HISTORY: Family History  Problem Relation Age of Onset  . Emphysema Mother   . Hyperlipidemia Father   . Liver disease Father   . Cancer Father        melanoma, colon cancer  . Stroke Sister   . Heart disease Brother 18       stents  . Cancer Brother        SKIN, TESTICULAR  . Memory loss Brother        ?dementia  . Heart attack Brother   . Diabetes Maternal Grandmother   . Cancer Paternal Grandfather   . Hypertension Neg Hx   . Breast cancer Neg Hx   . Headache Neg Hx   . Migraines Neg Hx     SOCIAL HISTORY: Social History   Socioeconomic History  . Marital status: Married    Spouse name: Richard  . Number of children: 2  . Years of education: Not on file  . Highest education level: Not on file  Occupational History    Comment: retired Pharmacist, hospital  Tobacco Use  . Smoking status: Never Smoker  . Smokeless tobacco: Never Used  Substance and Sexual Activity  . Alcohol use: Yes    Alcohol/week: 2.0 standard drinks    Types: 2 Glasses of wine per week    Comment: social  . Drug use: No  . Sexual activity: Yes  Other Topics Concern  . Not on file  Social History Narrative   Lives with spouse   Right handed   Caffeine: about 5 cups/day   Social Determinants of Health   Financial Resource Strain:   . Difficulty of Paying Living Expenses:   Food Insecurity:   . Worried About Charity fundraiser in the Last Year:   . Arboriculturist in the Last Year:   Transportation Needs:   . Film/video editor (Medical):   [name] Lack of Transportation (Non-Medical):   Physical Activity:   . Days of Exercise per Week:   . Minutes of Exercise per Session:   Stress:   . Feeling of Stress :   Social Connections:   . Frequency of Communication with Friends  and Family:   . Frequency of Social Gatherings with Friends and Family:   . Attends Religious Services:   . Active Member of Clubs or Organizations:   . Attends Archivist Meetings:   [name] Marital Status:   Intimate Partner Violence:   . Fear of Current or Ex-Partner:   . Emotionally Abused:   [name] Physically Abused:   . Sexually Abused:      PHYSICAL EXAM   Vitals:   02/05/20 0940  BP: [name])  142/83  Pulse: 69  Temp: (!) 97.4 F (36.3 C)  Weight: 122 lb (55.3 kg)  Height: 4' 11.5" (1.511 m)    Not recorded      Body mass index is 24.23 kg/m.  PHYSICAL EXAMNIATION:  Gen: NAD, conversant, well nourised, well groomed                     Cardiovascular: Regular rate rhythm, no peripheral edema, warm, nontender. Eyes: Conjunctivae clear without exudates or hemorrhage Neck: Supple, no carotid bruits.  Tenderness of left nuchal line upon deep palpitation Pulmonary: Clear to auscultation bilaterally   NEUROLOGICAL EXAM:  MENTAL STATUS: Speech:    Speech is normal; fluent and spontaneous with normal comprehension.  Cognition:     Orientation to time, place and person     Normal recent and remote memory     Normal Attention span and concentration     Normal Language, naming, repeating,spontaneous speech     Fund of knowledge   CRANIAL NERVES: CN II: Visual fields are full to confrontation. Pupils are round equal and briskly reactive to light. CN III, IV, VI: extraocular movement are normal. No ptosis. CN V: Facial sensation is intact to light touch CN VII: Face is symmetric with normal eye closure  CN VIII: Hearing is normal to causal conversation. CN IX, X: Phonation is normal. CN XI: Head turning and shoulder shrug are intact  MOTOR: There is no pronator drift of out-stretched arms. Muscle bulk and tone are normal. Muscle strength is normal.  REFLEXES: Reflexes are 2+ and symmetric at the biceps, triceps, knees, and ankles. Plantar responses are  flexor.  SENSORY: Intact to light touch, pinprick and vibratory sensation are intact in fingers and toes.  COORDINATION: There is no trunk or limb dysmetria noted.  GAIT/STANCE: Posture is normal. Gait is steady with normal steps, base, arm swing, and turning. Heel and toe walking are normal. Tandem gait is normal.  Romberg is absent.   DIAGNOSTIC DATA (LABS, IMAGING, TESTING) - I reviewed patient records, labs, notes, testing and imaging myself where available.   ASSESSMENT AND [place] is a 74 y.o. female   Left cervical occipital neuralgia  I have suggested hot compression, neck stretching, massage,   Marcial Pacas, M.D. Ph.D.  [facility] [address], [place], [place] [address] Ph: [phone] Fax: [phone]  CC: Shirline Frees, MD

## 2020-02-27 DIAGNOSIS — J309 Allergic rhinitis, unspecified: Secondary | ICD-10-CM | POA: Diagnosis not present

## 2020-02-29 DIAGNOSIS — M1811 Unilateral primary osteoarthritis of first carpometacarpal joint, right hand: Secondary | ICD-10-CM | POA: Diagnosis not present

## 2020-02-29 DIAGNOSIS — R52 Pain, unspecified: Secondary | ICD-10-CM | POA: Diagnosis not present

## 2020-02-29 DIAGNOSIS — M1812 Unilateral primary osteoarthritis of first carpometacarpal joint, left hand: Secondary | ICD-10-CM | POA: Diagnosis not present

## 2020-02-29 DIAGNOSIS — M19041 Primary osteoarthritis, right hand: Secondary | ICD-10-CM | POA: Diagnosis not present

## 2020-03-07 DIAGNOSIS — G2581 Restless legs syndrome: Secondary | ICD-10-CM | POA: Diagnosis not present

## 2020-03-07 DIAGNOSIS — J309 Allergic rhinitis, unspecified: Secondary | ICD-10-CM | POA: Diagnosis not present

## 2020-03-07 DIAGNOSIS — I10 Essential (primary) hypertension: Secondary | ICD-10-CM | POA: Diagnosis not present

## 2020-03-07 DIAGNOSIS — E039 Hypothyroidism, unspecified: Secondary | ICD-10-CM | POA: Diagnosis not present

## 2020-03-07 DIAGNOSIS — E7800 Pure hypercholesterolemia, unspecified: Secondary | ICD-10-CM | POA: Diagnosis not present

## 2020-03-07 DIAGNOSIS — J449 Chronic obstructive pulmonary disease, unspecified: Secondary | ICD-10-CM | POA: Diagnosis not present

## 2020-03-07 DIAGNOSIS — E200 Idiopathic hypoparathyroidism: Secondary | ICD-10-CM | POA: Diagnosis not present

## 2020-03-15 DIAGNOSIS — J449 Chronic obstructive pulmonary disease, unspecified: Secondary | ICD-10-CM | POA: Diagnosis not present

## 2020-03-15 DIAGNOSIS — I10 Essential (primary) hypertension: Secondary | ICD-10-CM | POA: Diagnosis not present

## 2020-03-15 DIAGNOSIS — I2584 Coronary atherosclerosis due to calcified coronary lesion: Secondary | ICD-10-CM | POA: Diagnosis not present

## 2020-03-15 DIAGNOSIS — E039 Hypothyroidism, unspecified: Secondary | ICD-10-CM | POA: Diagnosis not present

## 2020-03-15 DIAGNOSIS — M810 Age-related osteoporosis without current pathological fracture: Secondary | ICD-10-CM | POA: Diagnosis not present

## 2020-03-15 DIAGNOSIS — I2510 Atherosclerotic heart disease of native coronary artery without angina pectoris: Secondary | ICD-10-CM | POA: Diagnosis not present

## 2020-03-15 DIAGNOSIS — E7800 Pure hypercholesterolemia, unspecified: Secondary | ICD-10-CM | POA: Diagnosis not present

## 2020-03-29 DIAGNOSIS — M545 Low back pain: Secondary | ICD-10-CM | POA: Diagnosis not present

## 2020-04-12 DIAGNOSIS — H00024 Hordeolum internum left upper eyelid: Secondary | ICD-10-CM | POA: Diagnosis not present

## 2020-04-13 ENCOUNTER — Ambulatory Visit
Admission: RE | Admit: 2020-04-13 | Discharge: 2020-04-13 | Disposition: A | Discharge status: Home / Self Care | Payer: Medicare Other | Source: Ambulatory Visit | Attending: Physician Assistant | Admitting: Physician Assistant

## 2020-04-13 ENCOUNTER — Other Ambulatory Visit: Payer: Self-pay

## 2020-04-13 ENCOUNTER — Other Ambulatory Visit: Payer: Self-pay | Admitting: Physician Assistant

## 2020-04-13 DIAGNOSIS — M5441 Lumbago with sciatica, right side: Secondary | ICD-10-CM

## 2020-04-13 DIAGNOSIS — M545 Low back pain: Secondary | ICD-10-CM | POA: Diagnosis not present

## 2020-04-28 ENCOUNTER — Other Ambulatory Visit: Payer: Self-pay | Admitting: Family Medicine

## 2020-04-28 DIAGNOSIS — Z1231 Encounter for screening mammogram for malignant neoplasm of breast: Secondary | ICD-10-CM

## 2020-05-17 DIAGNOSIS — T783XXA Angioneurotic edema, initial encounter: Secondary | ICD-10-CM | POA: Diagnosis not present

## 2020-05-18 DIAGNOSIS — I10 Essential (primary) hypertension: Secondary | ICD-10-CM | POA: Diagnosis not present

## 2020-05-18 DIAGNOSIS — T783XXD Angioneurotic edema, subsequent encounter: Secondary | ICD-10-CM | POA: Diagnosis not present

## 2020-05-26 ENCOUNTER — Other Ambulatory Visit: Payer: Self-pay | Admitting: Family Medicine

## 2020-05-26 DIAGNOSIS — I10 Essential (primary) hypertension: Secondary | ICD-10-CM | POA: Diagnosis not present

## 2020-05-26 DIAGNOSIS — G2581 Restless legs syndrome: Secondary | ICD-10-CM | POA: Diagnosis not present

## 2020-05-26 DIAGNOSIS — M5416 Radiculopathy, lumbar region: Secondary | ICD-10-CM

## 2020-05-26 DIAGNOSIS — E039 Hypothyroidism, unspecified: Secondary | ICD-10-CM | POA: Diagnosis not present

## 2020-05-26 DIAGNOSIS — E7800 Pure hypercholesterolemia, unspecified: Secondary | ICD-10-CM | POA: Diagnosis not present

## 2020-05-27 ENCOUNTER — Other Ambulatory Visit: Payer: Self-pay

## 2020-05-27 ENCOUNTER — Ambulatory Visit
Admission: RE | Admit: 2020-05-27 | Discharge: 2020-05-27 | Disposition: A | Discharge status: Home / Self Care | Payer: Medicare Other | Source: Ambulatory Visit | Attending: Family Medicine | Admitting: Family Medicine

## 2020-05-27 DIAGNOSIS — M5416 Radiculopathy, lumbar region: Secondary | ICD-10-CM

## 2020-05-27 DIAGNOSIS — M48061 Spinal stenosis, lumbar region without neurogenic claudication: Secondary | ICD-10-CM | POA: Diagnosis not present

## 2020-06-02 ENCOUNTER — Ambulatory Visit
Admission: RE | Admit: 2020-06-02 | Discharge: 2020-06-02 | Disposition: A | Discharge status: Home / Self Care | Payer: Medicare Other | Source: Ambulatory Visit | Attending: Family Medicine | Admitting: Family Medicine

## 2020-06-02 ENCOUNTER — Other Ambulatory Visit: Payer: Self-pay

## 2020-06-02 DIAGNOSIS — Z1231 Encounter for screening mammogram for malignant neoplasm of breast: Secondary | ICD-10-CM | POA: Diagnosis not present

## 2020-06-16 DIAGNOSIS — M5126 Other intervertebral disc displacement, lumbar region: Secondary | ICD-10-CM | POA: Diagnosis not present

## 2020-06-30 DIAGNOSIS — H04123 Dry eye syndrome of bilateral lacrimal glands: Secondary | ICD-10-CM | POA: Diagnosis not present

## 2020-06-30 DIAGNOSIS — H2513 Age-related nuclear cataract, bilateral: Secondary | ICD-10-CM | POA: Diagnosis not present

## 2020-06-30 DIAGNOSIS — H43813 Vitreous degeneration, bilateral: Secondary | ICD-10-CM | POA: Diagnosis not present

## 2020-07-09 DIAGNOSIS — Z23 Encounter for immunization: Secondary | ICD-10-CM | POA: Diagnosis not present

## 2020-07-20 DIAGNOSIS — Z20828 Contact with and (suspected) exposure to other viral communicable diseases: Secondary | ICD-10-CM | POA: Diagnosis not present

## 2020-07-26 DIAGNOSIS — M5416 Radiculopathy, lumbar region: Secondary | ICD-10-CM | POA: Diagnosis not present

## 2020-07-26 DIAGNOSIS — M5136 Other intervertebral disc degeneration, lumbar region: Secondary | ICD-10-CM | POA: Diagnosis not present

## 2020-07-29 DIAGNOSIS — Z23 Encounter for immunization: Secondary | ICD-10-CM | POA: Diagnosis not present

## 2020-08-08 ENCOUNTER — Ambulatory Visit: Payer: Medicare Other

## 2020-08-09 DIAGNOSIS — H2513 Age-related nuclear cataract, bilateral: Secondary | ICD-10-CM | POA: Diagnosis not present

## 2020-08-09 DIAGNOSIS — H18413 Arcus senilis, bilateral: Secondary | ICD-10-CM | POA: Diagnosis not present

## 2020-08-09 DIAGNOSIS — H353131 Nonexudative age-related macular degeneration, bilateral, early dry stage: Secondary | ICD-10-CM | POA: Diagnosis not present

## 2020-08-09 DIAGNOSIS — H25013 Cortical age-related cataract, bilateral: Secondary | ICD-10-CM | POA: Diagnosis not present

## 2020-08-09 DIAGNOSIS — H2511 Age-related nuclear cataract, right eye: Secondary | ICD-10-CM | POA: Diagnosis not present

## 2020-08-09 DIAGNOSIS — H25043 Posterior subcapsular polar age-related cataract, bilateral: Secondary | ICD-10-CM | POA: Diagnosis not present

## 2020-08-15 ENCOUNTER — Ambulatory Visit: Payer: Medicare Other

## 2020-08-23 DIAGNOSIS — Z23 Encounter for immunization: Secondary | ICD-10-CM | POA: Diagnosis not present

## 2020-08-23 DIAGNOSIS — E039 Hypothyroidism, unspecified: Secondary | ICD-10-CM | POA: Diagnosis not present

## 2020-08-23 DIAGNOSIS — E7800 Pure hypercholesterolemia, unspecified: Secondary | ICD-10-CM | POA: Diagnosis not present

## 2020-08-23 DIAGNOSIS — I10 Essential (primary) hypertension: Secondary | ICD-10-CM | POA: Diagnosis not present

## 2020-08-23 DIAGNOSIS — E559 Vitamin D deficiency, unspecified: Secondary | ICD-10-CM | POA: Diagnosis not present

## 2020-09-09 DIAGNOSIS — H2512 Age-related nuclear cataract, left eye: Secondary | ICD-10-CM | POA: Diagnosis not present

## 2020-09-09 DIAGNOSIS — H25811 Combined forms of age-related cataract, right eye: Secondary | ICD-10-CM | POA: Diagnosis not present

## 2020-09-09 DIAGNOSIS — H2511 Age-related nuclear cataract, right eye: Secondary | ICD-10-CM | POA: Diagnosis not present

## 2020-09-15 DIAGNOSIS — E7800 Pure hypercholesterolemia, unspecified: Secondary | ICD-10-CM | POA: Diagnosis not present

## 2020-09-15 DIAGNOSIS — Z0000 Encounter for general adult medical examination without abnormal findings: Secondary | ICD-10-CM | POA: Diagnosis not present

## 2020-09-15 DIAGNOSIS — J449 Chronic obstructive pulmonary disease, unspecified: Secondary | ICD-10-CM | POA: Diagnosis not present

## 2020-09-15 DIAGNOSIS — R7303 Prediabetes: Secondary | ICD-10-CM | POA: Diagnosis not present

## 2020-09-15 DIAGNOSIS — M5441 Lumbago with sciatica, right side: Secondary | ICD-10-CM | POA: Diagnosis not present

## 2020-09-15 DIAGNOSIS — I2584 Coronary atherosclerosis due to calcified coronary lesion: Secondary | ICD-10-CM | POA: Diagnosis not present

## 2020-09-15 DIAGNOSIS — G8929 Other chronic pain: Secondary | ICD-10-CM | POA: Diagnosis not present

## 2020-09-15 DIAGNOSIS — M810 Age-related osteoporosis without current pathological fracture: Secondary | ICD-10-CM | POA: Diagnosis not present

## 2020-09-15 DIAGNOSIS — I10 Essential (primary) hypertension: Secondary | ICD-10-CM | POA: Diagnosis not present

## 2020-09-15 DIAGNOSIS — E559 Vitamin D deficiency, unspecified: Secondary | ICD-10-CM | POA: Diagnosis not present

## 2020-09-15 DIAGNOSIS — E200 Idiopathic hypoparathyroidism: Secondary | ICD-10-CM | POA: Diagnosis not present

## 2020-09-15 DIAGNOSIS — E039 Hypothyroidism, unspecified: Secondary | ICD-10-CM | POA: Diagnosis not present

## 2020-10-03 DIAGNOSIS — H2512 Age-related nuclear cataract, left eye: Secondary | ICD-10-CM | POA: Diagnosis not present

## 2020-10-03 DIAGNOSIS — H25812 Combined forms of age-related cataract, left eye: Secondary | ICD-10-CM | POA: Diagnosis not present

## 2020-10-17 DIAGNOSIS — M810 Age-related osteoporosis without current pathological fracture: Secondary | ICD-10-CM | POA: Diagnosis not present

## 2020-10-17 DIAGNOSIS — R7303 Prediabetes: Secondary | ICD-10-CM | POA: Diagnosis not present

## 2020-10-18 DIAGNOSIS — J449 Chronic obstructive pulmonary disease, unspecified: Secondary | ICD-10-CM | POA: Diagnosis not present

## 2020-10-18 DIAGNOSIS — E7800 Pure hypercholesterolemia, unspecified: Secondary | ICD-10-CM | POA: Diagnosis not present

## 2020-10-18 DIAGNOSIS — E039 Hypothyroidism, unspecified: Secondary | ICD-10-CM | POA: Diagnosis not present

## 2020-10-18 DIAGNOSIS — I10 Essential (primary) hypertension: Secondary | ICD-10-CM | POA: Diagnosis not present

## 2020-10-18 DIAGNOSIS — M810 Age-related osteoporosis without current pathological fracture: Secondary | ICD-10-CM | POA: Diagnosis not present

## 2020-10-18 DIAGNOSIS — I2510 Atherosclerotic heart disease of native coronary artery without angina pectoris: Secondary | ICD-10-CM | POA: Diagnosis not present

## 2020-10-18 DIAGNOSIS — I2584 Coronary atherosclerosis due to calcified coronary lesion: Secondary | ICD-10-CM | POA: Diagnosis not present

## 2020-10-18 DIAGNOSIS — G8929 Other chronic pain: Secondary | ICD-10-CM | POA: Diagnosis not present

## 2020-10-25 DIAGNOSIS — Z20822 Contact with and (suspected) exposure to covid-19: Secondary | ICD-10-CM | POA: Diagnosis not present

## 2020-11-09 DIAGNOSIS — R5383 Other fatigue: Secondary | ICD-10-CM | POA: Diagnosis not present

## 2020-11-09 DIAGNOSIS — R0981 Nasal congestion: Secondary | ICD-10-CM | POA: Diagnosis not present

## 2020-11-09 DIAGNOSIS — H9201 Otalgia, right ear: Secondary | ICD-10-CM | POA: Diagnosis not present

## 2020-11-10 DIAGNOSIS — R0981 Nasal congestion: Secondary | ICD-10-CM | POA: Diagnosis not present

## 2020-11-10 DIAGNOSIS — H9201 Otalgia, right ear: Secondary | ICD-10-CM | POA: Diagnosis not present

## 2020-11-10 DIAGNOSIS — R059 Cough, unspecified: Secondary | ICD-10-CM | POA: Diagnosis not present

## 2020-11-10 DIAGNOSIS — R5383 Other fatigue: Secondary | ICD-10-CM | POA: Diagnosis not present

## 2020-11-10 DIAGNOSIS — Z20822 Contact with and (suspected) exposure to covid-19: Secondary | ICD-10-CM | POA: Diagnosis not present

## 2020-11-29 DIAGNOSIS — G8929 Other chronic pain: Secondary | ICD-10-CM | POA: Diagnosis not present

## 2020-11-29 DIAGNOSIS — J449 Chronic obstructive pulmonary disease, unspecified: Secondary | ICD-10-CM | POA: Diagnosis not present

## 2020-11-29 DIAGNOSIS — M810 Age-related osteoporosis without current pathological fracture: Secondary | ICD-10-CM | POA: Diagnosis not present

## 2020-11-29 DIAGNOSIS — I10 Essential (primary) hypertension: Secondary | ICD-10-CM | POA: Diagnosis not present

## 2020-11-29 DIAGNOSIS — I2584 Coronary atherosclerosis due to calcified coronary lesion: Secondary | ICD-10-CM | POA: Diagnosis not present

## 2020-11-29 DIAGNOSIS — I2510 Atherosclerotic heart disease of native coronary artery without angina pectoris: Secondary | ICD-10-CM | POA: Diagnosis not present

## 2020-11-29 DIAGNOSIS — E7800 Pure hypercholesterolemia, unspecified: Secondary | ICD-10-CM | POA: Diagnosis not present

## 2020-11-29 DIAGNOSIS — E039 Hypothyroidism, unspecified: Secondary | ICD-10-CM | POA: Diagnosis not present

## 2020-12-08 DIAGNOSIS — H04123 Dry eye syndrome of bilateral lacrimal glands: Secondary | ICD-10-CM | POA: Diagnosis not present

## 2020-12-08 DIAGNOSIS — H01022 Squamous blepharitis right lower eyelid: Secondary | ICD-10-CM | POA: Diagnosis not present

## 2020-12-08 DIAGNOSIS — H01025 Squamous blepharitis left lower eyelid: Secondary | ICD-10-CM | POA: Diagnosis not present

## 2020-12-08 DIAGNOSIS — H11433 Conjunctival hyperemia, bilateral: Secondary | ICD-10-CM | POA: Diagnosis not present

## 2021-01-02 ENCOUNTER — Ambulatory Visit: Payer: Medicare Other | Attending: Internal Medicine

## 2021-01-02 ENCOUNTER — Other Ambulatory Visit (HOSPITAL_COMMUNITY): Payer: Self-pay | Admitting: Internal Medicine

## 2021-01-02 DIAGNOSIS — Z23 Encounter for immunization: Secondary | ICD-10-CM

## 2021-01-02 MED FILL — PFIZER-BIONT COVID-19 VAC-T: 30 | 21 days supply | Qty: 0 | Fill #0

## 2021-01-02 NOTE — Progress Notes (Signed)
   Covid-19 Vaccination Clinic  Name:  Kristine Cooper    MRN: [id number] DOB: 07-Mar-1946  01/02/2021  Kristine Cooper was observed post Covid-19 immunization for 15 minutes without incident. She was provided with Vaccine Information Sheet and instruction to access the V-Safe system.   Kristine Cooper was instructed to call 911 with any severe reactions post vaccine: [name] Difficulty breathing  . Swelling of face and throat  . A fast heartbeat  . A bad rash all over body  . Dizziness and weakness   Immunizations Administered    Name Date Dose VIS Date Route   PFIZER Comrnaty(Gray TOP) Covid-19 Vaccine 01/02/2021 10:37 AM 0.3 mL 10/13/2020 Intramuscular   Manufacturer: [employer]   Lot: [id number]   NDC: [phone]

## 2021-01-06 DIAGNOSIS — H01025 Squamous blepharitis left lower eyelid: Secondary | ICD-10-CM | POA: Diagnosis not present

## 2021-01-06 DIAGNOSIS — H01022 Squamous blepharitis right lower eyelid: Secondary | ICD-10-CM | POA: Diagnosis not present

## 2021-01-24 DIAGNOSIS — L821 Other seborrheic keratosis: Secondary | ICD-10-CM | POA: Diagnosis not present

## 2021-01-24 DIAGNOSIS — L820 Inflamed seborrheic keratosis: Secondary | ICD-10-CM | POA: Diagnosis not present

## 2021-01-24 DIAGNOSIS — D1801 Hemangioma of skin and subcutaneous tissue: Secondary | ICD-10-CM | POA: Diagnosis not present

## 2021-01-24 DIAGNOSIS — D2272 Melanocytic nevi of left lower limb, including hip: Secondary | ICD-10-CM | POA: Diagnosis not present

## 2021-02-01 DIAGNOSIS — R059 Cough, unspecified: Secondary | ICD-10-CM | POA: Diagnosis not present

## 2021-02-01 DIAGNOSIS — R0981 Nasal congestion: Secondary | ICD-10-CM | POA: Diagnosis not present

## 2021-02-01 DIAGNOSIS — J019 Acute sinusitis, unspecified: Secondary | ICD-10-CM | POA: Diagnosis not present

## 2021-02-01 DIAGNOSIS — J40 Bronchitis, not specified as acute or chronic: Secondary | ICD-10-CM | POA: Diagnosis not present

## 2021-02-01 DIAGNOSIS — Z8709 Personal history of other diseases of the respiratory system: Secondary | ICD-10-CM | POA: Diagnosis not present

## 2021-02-28 ENCOUNTER — Other Ambulatory Visit: Payer: Self-pay

## 2021-02-28 ENCOUNTER — Ambulatory Visit
Admission: RE | Admit: 2021-02-28 | Discharge: 2021-02-28 | Disposition: A | Discharge status: Home / Self Care | Payer: Medicare Other | Source: Ambulatory Visit | Attending: Family Medicine | Admitting: Family Medicine

## 2021-02-28 ENCOUNTER — Other Ambulatory Visit: Payer: Self-pay | Admitting: Family Medicine

## 2021-02-28 DIAGNOSIS — J8410 Pulmonary fibrosis, unspecified: Secondary | ICD-10-CM | POA: Diagnosis not present

## 2021-02-28 DIAGNOSIS — R0789 Other chest pain: Secondary | ICD-10-CM | POA: Diagnosis not present

## 2021-02-28 DIAGNOSIS — J40 Bronchitis, not specified as acute or chronic: Secondary | ICD-10-CM | POA: Diagnosis not present

## 2021-03-17 DIAGNOSIS — G2581 Restless legs syndrome: Secondary | ICD-10-CM | POA: Diagnosis not present

## 2021-03-17 DIAGNOSIS — E559 Vitamin D deficiency, unspecified: Secondary | ICD-10-CM | POA: Diagnosis not present

## 2021-03-17 DIAGNOSIS — J309 Allergic rhinitis, unspecified: Secondary | ICD-10-CM | POA: Diagnosis not present

## 2021-03-17 DIAGNOSIS — I2510 Atherosclerotic heart disease of native coronary artery without angina pectoris: Secondary | ICD-10-CM | POA: Diagnosis not present

## 2021-03-17 DIAGNOSIS — R059 Cough, unspecified: Secondary | ICD-10-CM | POA: Diagnosis not present

## 2021-03-17 DIAGNOSIS — E200 Idiopathic hypoparathyroidism: Secondary | ICD-10-CM | POA: Diagnosis not present

## 2021-03-17 DIAGNOSIS — J449 Chronic obstructive pulmonary disease, unspecified: Secondary | ICD-10-CM | POA: Diagnosis not present

## 2021-03-17 DIAGNOSIS — R7303 Prediabetes: Secondary | ICD-10-CM | POA: Diagnosis not present

## 2021-03-17 DIAGNOSIS — I10 Essential (primary) hypertension: Secondary | ICD-10-CM | POA: Diagnosis not present

## 2021-03-17 DIAGNOSIS — E7800 Pure hypercholesterolemia, unspecified: Secondary | ICD-10-CM | POA: Diagnosis not present

## 2021-03-17 DIAGNOSIS — E039 Hypothyroidism, unspecified: Secondary | ICD-10-CM | POA: Diagnosis not present

## 2021-04-11 ENCOUNTER — Ambulatory Visit
Admission: RE | Admit: 2021-04-11 | Discharge: 2021-04-11 | Disposition: A | Discharge status: Home / Self Care | Payer: Medicare Other | Source: Ambulatory Visit | Attending: Family Medicine | Admitting: Family Medicine

## 2021-04-11 ENCOUNTER — Other Ambulatory Visit: Payer: Self-pay | Admitting: Family Medicine

## 2021-04-11 DIAGNOSIS — R059 Cough, unspecified: Secondary | ICD-10-CM | POA: Diagnosis not present

## 2021-04-20 ENCOUNTER — Other Ambulatory Visit: Payer: Self-pay | Admitting: Family Medicine

## 2021-04-20 DIAGNOSIS — Z1231 Encounter for screening mammogram for malignant neoplasm of breast: Secondary | ICD-10-CM

## 2021-04-25 DIAGNOSIS — H0288A Meibomian gland dysfunction right eye, upper and lower eyelids: Secondary | ICD-10-CM | POA: Diagnosis not present

## 2021-04-25 DIAGNOSIS — H16223 Keratoconjunctivitis sicca, not specified as Sjogren's, bilateral: Secondary | ICD-10-CM | POA: Diagnosis not present

## 2021-05-15 DIAGNOSIS — M816 Localized osteoporosis [Lequesne]: Secondary | ICD-10-CM | POA: Diagnosis not present

## 2021-05-15 DIAGNOSIS — Z6824 Body mass index (BMI) 24.0-24.9, adult: Secondary | ICD-10-CM | POA: Diagnosis not present

## 2021-05-15 DIAGNOSIS — Z01419 Encounter for gynecological examination (general) (routine) without abnormal findings: Secondary | ICD-10-CM | POA: Diagnosis not present

## 2021-05-15 DIAGNOSIS — N958 Other specified menopausal and perimenopausal disorders: Secondary | ICD-10-CM | POA: Diagnosis not present

## 2021-05-23 DIAGNOSIS — F419 Anxiety disorder, unspecified: Secondary | ICD-10-CM | POA: Diagnosis not present

## 2021-06-07 ENCOUNTER — Ambulatory Visit
Admission: RE | Admit: 2021-06-07 | Discharge: 2021-06-07 | Disposition: A | Discharge status: Home / Self Care | Payer: Medicare Other | Source: Ambulatory Visit | Attending: Family Medicine | Admitting: Family Medicine

## 2021-06-07 ENCOUNTER — Other Ambulatory Visit: Payer: Self-pay

## 2021-06-07 DIAGNOSIS — Z1231 Encounter for screening mammogram for malignant neoplasm of breast: Secondary | ICD-10-CM

## 2022-02-04 IMAGING — CR DG LUMBAR SPINE COMPLETE 4+V
5 series · 5 of 5 positions shown · non-contrast
Comparison: Reformats from abdominal CT 03/11/2019

CLINICAL DATA: Acute right-sided low back pain with right-sided
sciatica. Acute on chronic pain, increased over the past 3 weeks. No
known injury.

EXAM:
LUMBAR SPINE - COMPLETE 4+ VIEW

[t lumbar spine ap]
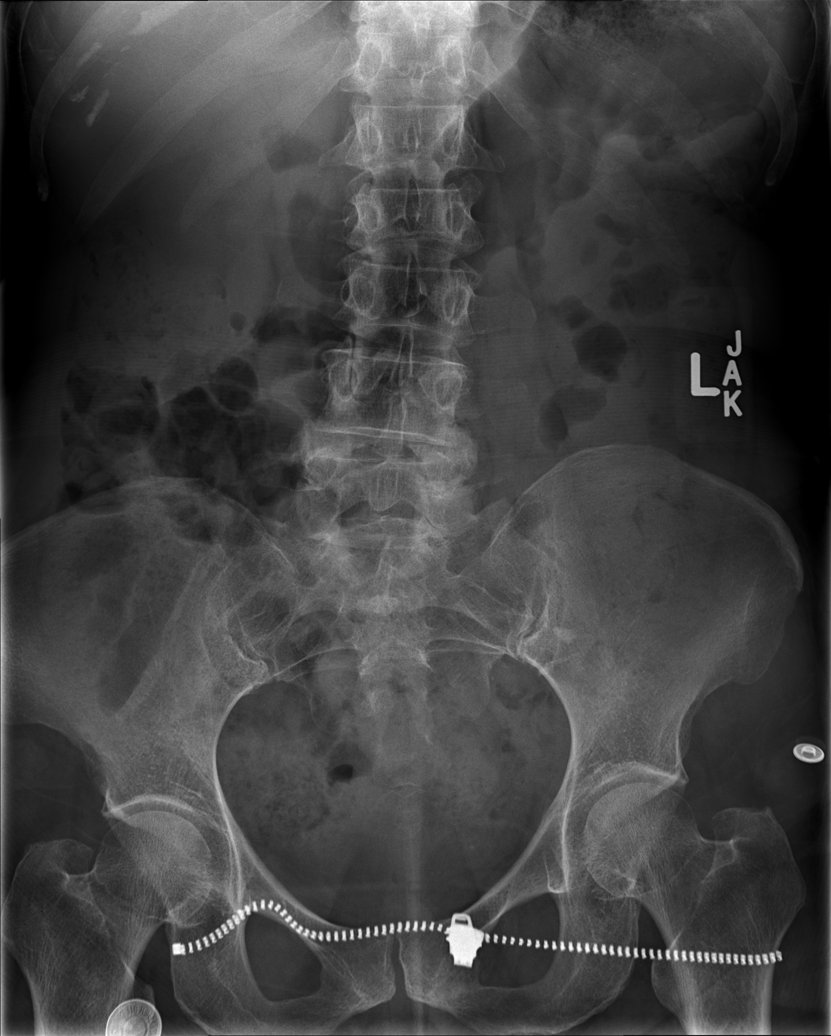

[t lumbar spine obl (1 of 2)]
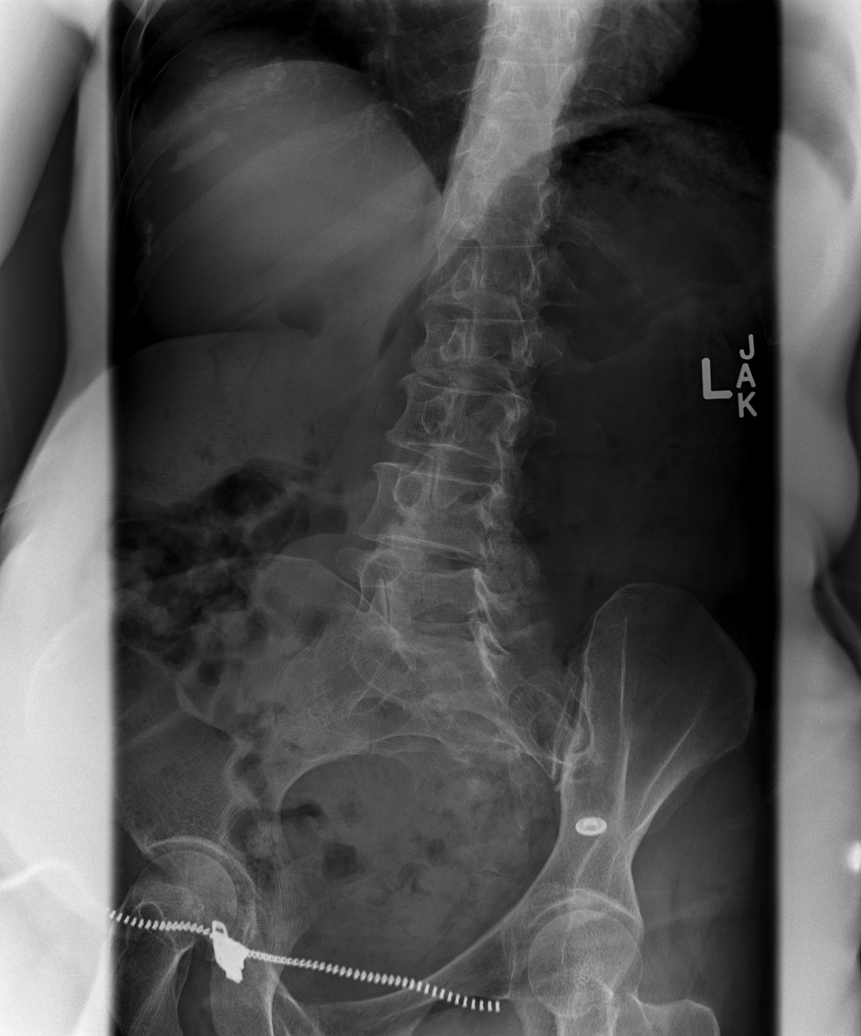

[t lumbar spine obl (2 of 2)]
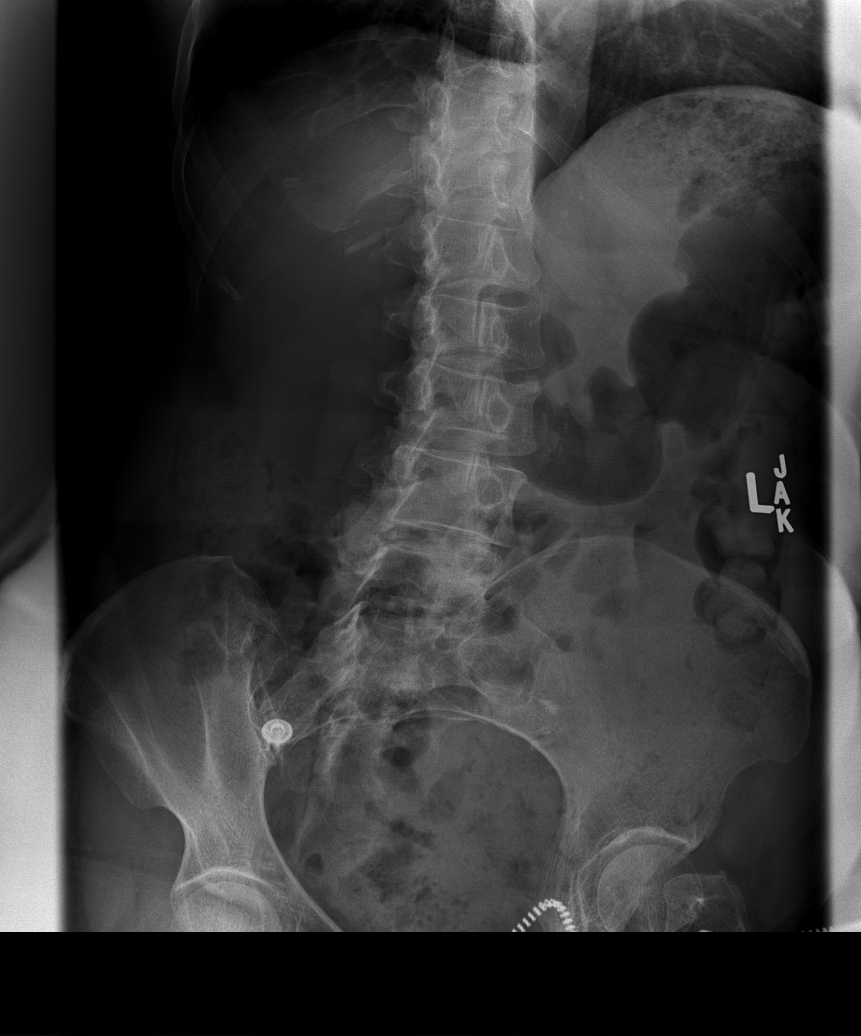

[t lumbar spine lat]
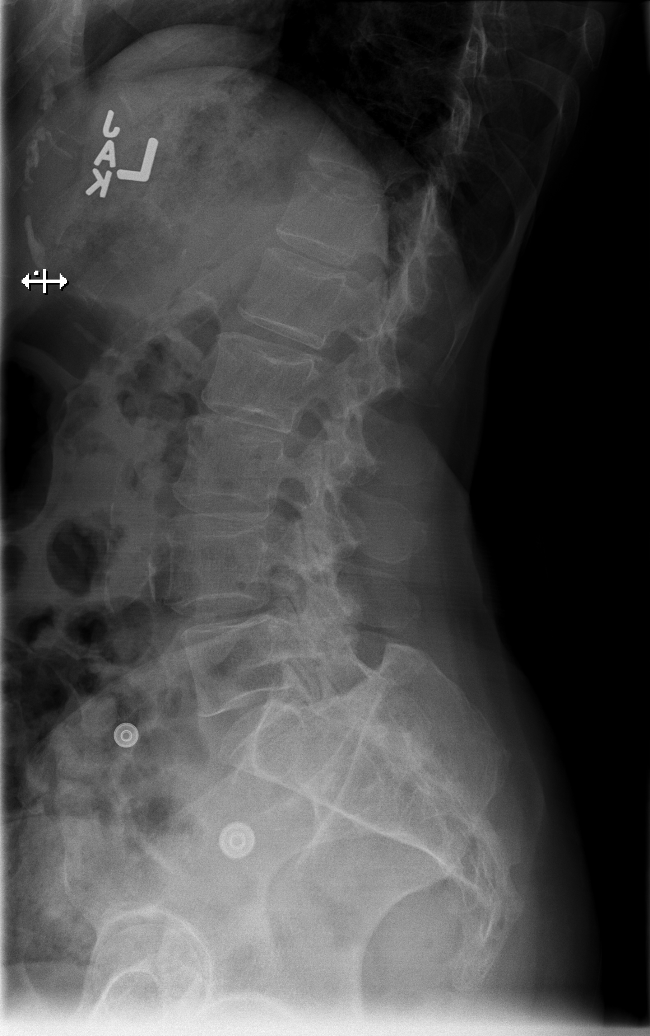

[t lumbar l-5 s-1 spot]
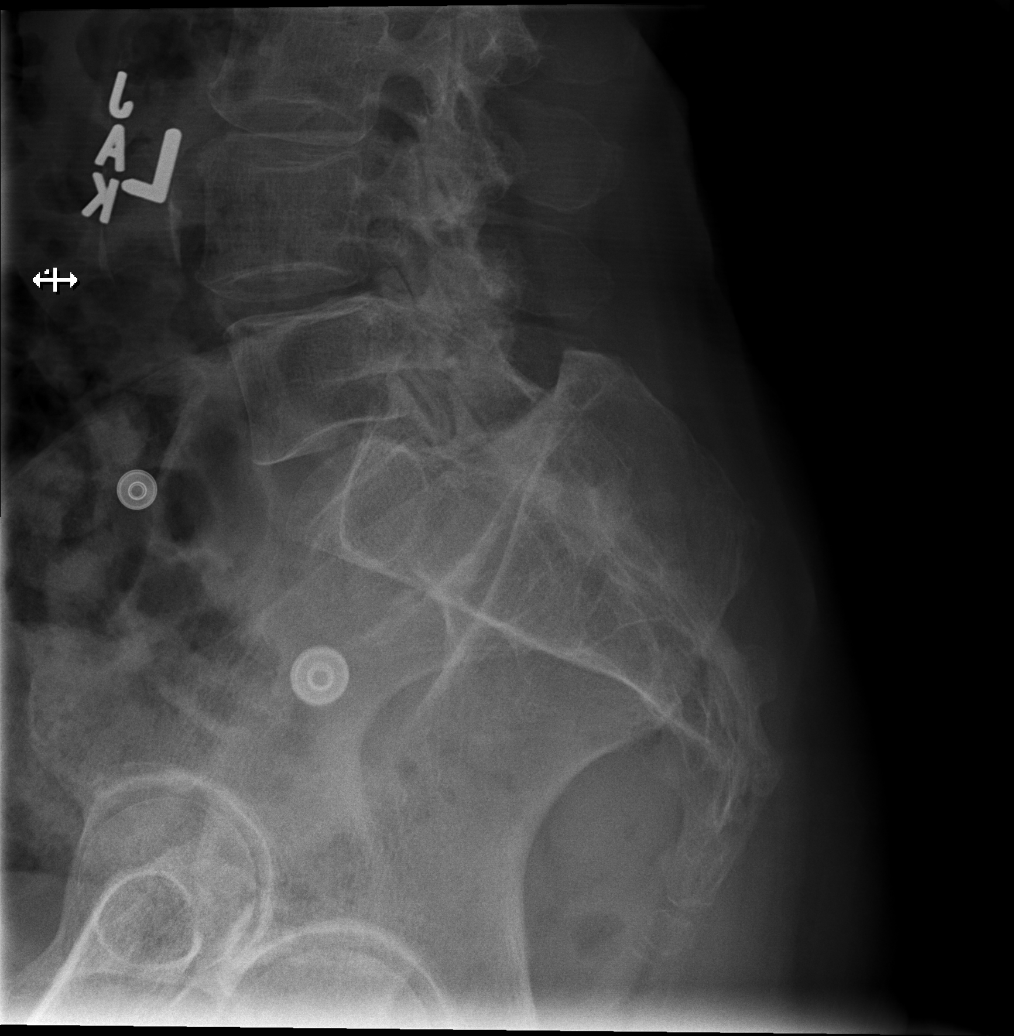

[5 of 5 positions shown; findings below may reference images not displayed]

FINDINGS: Trace anterolisthesis of L4 on L5 is likely related to facet
hypertrophy. Additional facet hypertrophy at L3-L4 and L5-S1.
Vertebral body heights are normal. There is no listhesis. The
posterior elements are intact. Minor endplate spurring at multiple
levels with mild disc space narrowing at L1-L2 and L2-L3. No
fracture. No evidence of destructive bone lesion. Stable left iliac
bone island. Sacroiliac joints are symmetric and normal.
IMPRESSION: 1. Multilevel facet hypertrophy, with trace anterolisthesis of L4 on
L5.
2. Mild degenerative disc disease.
